# Patient Record
Sex: Male | Born: 1950 | Race: White | Hispanic: No | Marital: Married | Smoking: Never smoker
Health system: Southern US, Community
[De-identification: ages and names within clinical notes are randomized; demographics above are authoritative.]

## PROBLEM LIST (undated history)

## (undated) DIAGNOSIS — K222 Esophageal obstruction: Secondary | ICD-10-CM

## (undated) DIAGNOSIS — K5790 Diverticulosis of intestine, part unspecified, without perforation or abscess without bleeding: Secondary | ICD-10-CM

## (undated) DIAGNOSIS — R739 Hyperglycemia, unspecified: Secondary | ICD-10-CM

## (undated) DIAGNOSIS — I739 Peripheral vascular disease, unspecified: Secondary | ICD-10-CM

## (undated) DIAGNOSIS — I10 Essential (primary) hypertension: Secondary | ICD-10-CM

## (undated) DIAGNOSIS — K219 Gastro-esophageal reflux disease without esophagitis: Secondary | ICD-10-CM

## (undated) DIAGNOSIS — K529 Noninfective gastroenteritis and colitis, unspecified: Secondary | ICD-10-CM

## (undated) DIAGNOSIS — F32A Depression, unspecified: Secondary | ICD-10-CM

## (undated) DIAGNOSIS — F329 Major depressive disorder, single episode, unspecified: Secondary | ICD-10-CM

## (undated) HISTORY — DX: Major depressive disorder, single episode, unspecified: F32.9

## (undated) HISTORY — DX: Peripheral vascular disease, unspecified: I73.9

## (undated) HISTORY — DX: Noninfective gastroenteritis and colitis, unspecified: K52.9

## (undated) HISTORY — DX: Gastro-esophageal reflux disease without esophagitis: K21.9

## (undated) HISTORY — DX: Essential (primary) hypertension: I10

## (undated) HISTORY — DX: Hyperglycemia, unspecified: R73.9

## (undated) HISTORY — DX: Diverticulosis of intestine, part unspecified, without perforation or abscess without bleeding: K57.90

## (undated) HISTORY — DX: Depression, unspecified: F32.A

## (undated) HISTORY — DX: Esophageal obstruction: K22.2

---

## 1998-03-14 ENCOUNTER — Ambulatory Visit (HOSPITAL_COMMUNITY): Admission: RE | Admit: 1998-03-14 | Discharge: 1998-03-14 | Payer: Self-pay | Admitting: Endocrinology

## 1999-04-30 ENCOUNTER — Encounter: Payer: Self-pay | Admitting: Endocrinology

## 1999-04-30 ENCOUNTER — Ambulatory Visit (HOSPITAL_COMMUNITY): Admission: RE | Admit: 1999-04-30 | Discharge: 1999-04-30 | Payer: Self-pay | Admitting: Endocrinology

## 1999-12-03 ENCOUNTER — Encounter: Admission: RE | Admit: 1999-12-03 | Discharge: 2000-03-02 | Payer: Self-pay | Admitting: Endocrinology

## 2000-12-22 ENCOUNTER — Emergency Department (HOSPITAL_COMMUNITY): Admission: EM | Admit: 2000-12-22 | Discharge: 2000-12-22 | Payer: Self-pay | Admitting: Emergency Medicine

## 2000-12-22 ENCOUNTER — Encounter: Payer: Self-pay | Admitting: Emergency Medicine

## 2005-04-08 ENCOUNTER — Emergency Department (HOSPITAL_COMMUNITY): Admission: EM | Admit: 2005-04-08 | Discharge: 2005-04-08 | Payer: Self-pay | Admitting: Family Medicine

## 2005-04-23 ENCOUNTER — Ambulatory Visit: Payer: Self-pay | Admitting: Endocrinology

## 2005-05-07 ENCOUNTER — Ambulatory Visit: Payer: Self-pay | Admitting: Endocrinology

## 2005-06-18 ENCOUNTER — Ambulatory Visit: Payer: Self-pay | Admitting: Endocrinology

## 2005-07-09 ENCOUNTER — Ambulatory Visit: Payer: Self-pay | Admitting: Endocrinology

## 2005-10-08 ENCOUNTER — Ambulatory Visit: Payer: Self-pay | Admitting: Endocrinology

## 2006-10-21 ENCOUNTER — Ambulatory Visit: Payer: Self-pay | Admitting: Endocrinology

## 2006-11-18 ENCOUNTER — Ambulatory Visit: Payer: Self-pay | Admitting: Endocrinology

## 2006-11-18 LAB — CONVERTED CEMR LAB
Basophils Relative: 1.7 % — ABNORMAL HIGH (ref 0.0–1.0)
Bilirubin Urine: NEGATIVE
Bilirubin, Direct: 0.1 mg/dL (ref 0.0–0.3)
CO2: 28 meq/L (ref 19–32)
Cholesterol: 228 mg/dL (ref 0–200)
Creatinine, Ser: 0.9 mg/dL (ref 0.4–1.5)
Direct LDL: 170.5 mg/dL
Eosinophils Relative: 8 % — ABNORMAL HIGH (ref 0.0–5.0)
GFR calc Af Amer: 114 mL/min
Glucose, Bld: 105 mg/dL — ABNORMAL HIGH (ref 70–99)
HCT: 44.3 % (ref 39.0–52.0)
HDL: 29.8 mg/dL — ABNORMAL LOW (ref >39.0)
Hemoglobin: 15.2 g/dL (ref 13.0–17.0)
Leukocytes, UA: NEGATIVE
Lymphocytes Relative: 30.6 % (ref 12.0–46.0)
Monocytes Absolute: 0.7 10*3/uL (ref 0.2–0.7)
Monocytes Relative: 10.2 % (ref 3.0–11.0)
Neutro Abs: 3.1 10*3/uL (ref 1.4–7.7)
Neutrophils Relative %: 49.5 % (ref 43.0–77.0)
Potassium: 4.4 meq/L (ref 3.5–5.1)
RDW: 13 % (ref 11.5–14.6)
Sodium: 140 meq/L (ref 135–145)
Specific Gravity, Urine: >=1.03 (ref 1.000–1.03)
TSH: 1.58 microintl units/mL (ref 0.35–5.50)
Total Bilirubin: 0.9 mg/dL (ref 0.3–1.2)
Total Protein: 7.2 g/dL (ref 6.0–8.3)
Urobilinogen, UA: 0.2 (ref 0.0–1.0)

## 2006-12-23 ENCOUNTER — Ambulatory Visit: Payer: Self-pay | Admitting: Endocrinology

## 2007-02-03 ENCOUNTER — Encounter: Payer: Self-pay | Admitting: Endocrinology

## 2007-02-10 ENCOUNTER — Ambulatory Visit: Payer: Self-pay | Admitting: Endocrinology

## 2007-03-12 ENCOUNTER — Encounter: Payer: Self-pay | Admitting: Endocrinology

## 2007-03-12 DIAGNOSIS — M109 Gout, unspecified: Secondary | ICD-10-CM

## 2007-03-12 DIAGNOSIS — I10 Essential (primary) hypertension: Secondary | ICD-10-CM | POA: Insufficient documentation

## 2007-03-17 ENCOUNTER — Ambulatory Visit: Payer: Self-pay | Admitting: Endocrinology

## 2007-03-17 LAB — CONVERTED CEMR LAB
ALT: 25 units/L (ref 0–53)
AST: 21 units/L (ref 0–37)
Alkaline Phosphatase: 57 units/L (ref 39–117)
BUN: 21 mg/dL (ref 6–23)
Bilirubin, Direct: 0.1 mg/dL (ref 0.0–0.3)
CO2: 27 meq/L (ref 19–32)
Calcium: 9.1 mg/dL (ref 8.4–10.5)
Chloride: 108 meq/L (ref 96–112)
Direct LDL: 163.6 mg/dL
GFR calc Af Amer: 100 mL/min
GFR calc non Af Amer: 83 mL/min
Glucose, Bld: 108 mg/dL — ABNORMAL HIGH (ref 70–99)
Testosterone: 275.66 ng/dL — ABNORMAL LOW (ref 350.00–890)
Total Protein: 7.4 g/dL (ref 6.0–8.3)
Triglycerides: 211 mg/dL (ref 0–149)

## 2007-03-24 ENCOUNTER — Ambulatory Visit: Payer: Self-pay | Admitting: Endocrinology

## 2007-07-14 ENCOUNTER — Ambulatory Visit: Payer: Self-pay | Admitting: Endocrinology

## 2007-07-14 DIAGNOSIS — E291 Testicular hypofunction: Secondary | ICD-10-CM | POA: Insufficient documentation

## 2007-07-14 DIAGNOSIS — E7800 Pure hypercholesterolemia, unspecified: Secondary | ICD-10-CM

## 2007-07-15 LAB — CONVERTED CEMR LAB
FSH: 5.5 milliintl units/mL
HDL: 27.7 mg/dL — ABNORMAL LOW (ref >39.0)
LH: 3.4 milliintl units/mL
Prolactin: 12.4 ng/mL
Total Bilirubin: 0.9 mg/dL (ref 0.3–1.2)
Total CHOL/HDL Ratio: 9
Total Protein: 7.9 g/dL (ref 6.0–8.3)
Triglycerides: 207 mg/dL (ref 0–149)

## 2007-07-21 ENCOUNTER — Ambulatory Visit: Payer: Self-pay | Admitting: Endocrinology

## 2007-07-27 ENCOUNTER — Encounter: Payer: Self-pay | Admitting: Endocrinology

## 2007-08-11 ENCOUNTER — Ambulatory Visit: Payer: Self-pay | Admitting: Gastroenterology

## 2007-08-18 ENCOUNTER — Encounter: Payer: Self-pay | Admitting: Gastroenterology

## 2007-08-18 ENCOUNTER — Ambulatory Visit: Payer: Self-pay | Admitting: Gastroenterology

## 2007-08-18 DIAGNOSIS — K5730 Diverticulosis of large intestine without perforation or abscess without bleeding: Secondary | ICD-10-CM | POA: Insufficient documentation

## 2007-08-18 DIAGNOSIS — K5289 Other specified noninfective gastroenteritis and colitis: Secondary | ICD-10-CM | POA: Insufficient documentation

## 2007-09-08 ENCOUNTER — Ambulatory Visit: Payer: Self-pay | Admitting: Gastroenterology

## 2007-09-08 DIAGNOSIS — R1310 Dysphagia, unspecified: Secondary | ICD-10-CM | POA: Insufficient documentation

## 2007-09-22 ENCOUNTER — Ambulatory Visit: Payer: Self-pay

## 2007-09-22 ENCOUNTER — Telehealth: Payer: Self-pay | Admitting: Endocrinology

## 2007-09-22 ENCOUNTER — Ambulatory Visit: Payer: Self-pay | Admitting: Endocrinology

## 2007-09-22 DIAGNOSIS — M79609 Pain in unspecified limb: Secondary | ICD-10-CM | POA: Insufficient documentation

## 2007-09-22 DIAGNOSIS — N529 Male erectile dysfunction, unspecified: Secondary | ICD-10-CM | POA: Insufficient documentation

## 2007-10-16 ENCOUNTER — Encounter: Payer: Self-pay | Admitting: Endocrinology

## 2007-10-27 ENCOUNTER — Ambulatory Visit: Payer: Self-pay | Admitting: Gastroenterology

## 2007-11-03 ENCOUNTER — Ambulatory Visit: Payer: Self-pay | Admitting: Endocrinology

## 2007-11-03 DIAGNOSIS — K769 Liver disease, unspecified: Secondary | ICD-10-CM

## 2007-11-07 LAB — CONVERTED CEMR LAB
ALT: 24 units/L (ref 0–53)
AST: 22 units/L (ref 0–37)
Albumin: 3.7 g/dL (ref 3.5–5.2)
Hgb A1c MFr Bld: 5.9 % (ref 4.6–6.0)
Total Protein: 7.5 g/dL (ref 6.0–8.3)
Uric Acid, Serum: 8 mg/dL — ABNORMAL HIGH (ref 4.0–7.8)

## 2007-11-17 ENCOUNTER — Ambulatory Visit: Payer: Self-pay | Admitting: Gastroenterology

## 2007-11-17 ENCOUNTER — Encounter: Payer: Self-pay | Admitting: Gastroenterology

## 2007-11-17 DIAGNOSIS — K222 Esophageal obstruction: Secondary | ICD-10-CM

## 2007-11-17 DIAGNOSIS — K2900 Acute gastritis without bleeding: Secondary | ICD-10-CM | POA: Insufficient documentation

## 2007-12-05 ENCOUNTER — Encounter: Payer: Self-pay | Admitting: Endocrinology

## 2007-12-05 DIAGNOSIS — M1990 Unspecified osteoarthritis, unspecified site: Secondary | ICD-10-CM | POA: Insufficient documentation

## 2007-12-21 ENCOUNTER — Encounter: Payer: Self-pay | Admitting: Endocrinology

## 2007-12-28 DIAGNOSIS — K219 Gastro-esophageal reflux disease without esophagitis: Secondary | ICD-10-CM

## 2008-01-09 ENCOUNTER — Telehealth (INDEPENDENT_AMBULATORY_CARE_PROVIDER_SITE_OTHER): Payer: Self-pay | Admitting: *Deleted

## 2008-01-12 ENCOUNTER — Ambulatory Visit: Payer: Self-pay | Admitting: Endocrinology

## 2008-01-12 DIAGNOSIS — R109 Unspecified abdominal pain: Secondary | ICD-10-CM

## 2008-01-18 LAB — CONVERTED CEMR LAB
Basophils Absolute: 0 10*3/uL (ref 0.0–0.1)
Bilirubin Urine: NEGATIVE
Hemoglobin: 15.1 g/dL (ref 13.0–17.0)
Leukocytes, UA: NEGATIVE
Lymphocytes Relative: 24.8 % (ref 12.0–46.0)
MCHC: 34.9 g/dL (ref 30.0–36.0)
Neutro Abs: 4.6 10*3/uL (ref 1.4–7.7)
Neutrophils Relative %: 60.3 % (ref 43.0–77.0)
Nitrite: NEGATIVE
Platelets: 370 10*3/uL (ref 150–400)
RDW: 13 % (ref 11.5–14.6)
pH: 5.5 (ref 5.0–8.0)

## 2008-01-19 ENCOUNTER — Ambulatory Visit: Payer: Self-pay | Admitting: Endocrinology

## 2008-01-19 DIAGNOSIS — L255 Unspecified contact dermatitis due to plants, except food: Secondary | ICD-10-CM

## 2008-12-27 ENCOUNTER — Ambulatory Visit: Payer: Self-pay | Admitting: Endocrinology

## 2008-12-27 DIAGNOSIS — F329 Major depressive disorder, single episode, unspecified: Secondary | ICD-10-CM

## 2008-12-27 DIAGNOSIS — R51 Headache: Secondary | ICD-10-CM

## 2008-12-27 DIAGNOSIS — R519 Headache, unspecified: Secondary | ICD-10-CM | POA: Insufficient documentation

## 2008-12-27 LAB — CONVERTED CEMR LAB
BUN: 18 mg/dL (ref 6–23)
Calcium: 9.3 mg/dL (ref 8.4–10.5)
GFR calc non Af Amer: 82.17 mL/min (ref >60)
Glucose, Bld: 84 mg/dL (ref 70–99)
Sodium: 143 meq/L (ref 135–145)

## 2009-01-02 ENCOUNTER — Ambulatory Visit: Payer: Self-pay | Admitting: Internal Medicine

## 2009-01-02 DIAGNOSIS — M25539 Pain in unspecified wrist: Secondary | ICD-10-CM

## 2009-01-09 ENCOUNTER — Encounter: Payer: Self-pay | Admitting: Endocrinology

## 2009-02-11 ENCOUNTER — Telehealth: Payer: Self-pay | Admitting: Endocrinology

## 2009-03-12 ENCOUNTER — Emergency Department (HOSPITAL_BASED_OUTPATIENT_CLINIC_OR_DEPARTMENT_OTHER): Admission: EM | Admit: 2009-03-12 | Discharge: 2009-03-12 | Payer: Self-pay | Admitting: Emergency Medicine

## 2009-03-21 ENCOUNTER — Telehealth: Payer: Self-pay | Admitting: Endocrinology

## 2009-04-04 ENCOUNTER — Telehealth: Payer: Self-pay | Admitting: Endocrinology

## 2009-08-22 ENCOUNTER — Emergency Department (HOSPITAL_COMMUNITY): Admission: EM | Admit: 2009-08-22 | Discharge: 2009-08-22 | Payer: Self-pay | Admitting: Emergency Medicine

## 2009-08-23 ENCOUNTER — Telehealth: Payer: Self-pay | Admitting: Internal Medicine

## 2009-08-26 ENCOUNTER — Encounter (INDEPENDENT_AMBULATORY_CARE_PROVIDER_SITE_OTHER): Payer: Self-pay | Admitting: *Deleted

## 2009-09-09 ENCOUNTER — Encounter (INDEPENDENT_AMBULATORY_CARE_PROVIDER_SITE_OTHER): Payer: Self-pay

## 2009-09-10 ENCOUNTER — Ambulatory Visit: Payer: Self-pay | Admitting: Gastroenterology

## 2009-09-19 ENCOUNTER — Telehealth: Payer: Self-pay | Admitting: Gastroenterology

## 2009-09-19 ENCOUNTER — Ambulatory Visit: Payer: Self-pay | Admitting: Gastroenterology

## 2009-09-23 ENCOUNTER — Encounter: Payer: Self-pay | Admitting: Gastroenterology

## 2009-09-23 ENCOUNTER — Telehealth: Payer: Self-pay | Admitting: Gastroenterology

## 2009-10-01 ENCOUNTER — Ambulatory Visit (HOSPITAL_COMMUNITY): Admission: RE | Admit: 2009-10-01 | Discharge: 2009-10-01 | Payer: Self-pay | Admitting: Gastroenterology

## 2009-10-01 ENCOUNTER — Ambulatory Visit: Payer: Self-pay | Admitting: Gastroenterology

## 2009-10-02 ENCOUNTER — Telehealth: Payer: Self-pay | Admitting: Gastroenterology

## 2009-10-14 ENCOUNTER — Ambulatory Visit: Payer: Self-pay | Admitting: Endocrinology

## 2009-10-14 LAB — CONVERTED CEMR LAB
ALT: 22 units/L (ref 0–53)
Basophils Relative: 1.1 % (ref 0.0–3.0)
Bilirubin, Direct: 0.1 mg/dL (ref 0.0–0.3)
Chloride: 109 meq/L (ref 96–112)
Direct LDL: 169.3 mg/dL
Eosinophils Relative: 3.1 % (ref 0.0–5.0)
HCT: 47.5 % (ref 39.0–52.0)
Hemoglobin, Urine: NEGATIVE
Hemoglobin: 16 g/dL (ref 13.0–17.0)
Lymphs Abs: 1.7 10*3/uL (ref 0.7–4.0)
MCV: 87.9 fL (ref 78.0–100.0)
Monocytes Absolute: 0.9 10*3/uL (ref 0.1–1.0)
Potassium: 4.7 meq/L (ref 3.5–5.1)
RBC: 5.41 M/uL (ref 4.22–5.81)
Total CHOL/HDL Ratio: 6
Total Protein, Urine: NEGATIVE mg/dL
Total Protein: 8.4 g/dL — ABNORMAL HIGH (ref 6.0–8.3)
Uric Acid, Serum: 4.3 mg/dL (ref 4.0–7.8)
Urine Glucose: NEGATIVE mg/dL
VLDL: 24.6 mg/dL (ref 0.0–40.0)
WBC: 9.7 10*3/uL (ref 4.5–10.5)

## 2009-10-16 ENCOUNTER — Telehealth: Payer: Self-pay | Admitting: Endocrinology

## 2009-12-05 ENCOUNTER — Telehealth: Payer: Self-pay | Admitting: Endocrinology

## 2010-03-06 ENCOUNTER — Ambulatory Visit: Payer: Self-pay | Admitting: Endocrinology

## 2010-03-06 ENCOUNTER — Telehealth: Payer: Self-pay | Admitting: Endocrinology

## 2010-03-07 LAB — CONVERTED CEMR LAB
BUN: 22 mg/dL (ref 6–23)
CO2: 27 meq/L (ref 19–32)
Chloride: 107 meq/L (ref 96–112)
Creatinine, Ser: 1.1 mg/dL (ref 0.4–1.5)
Glucose, Bld: 85 mg/dL (ref 70–99)

## 2010-05-29 ENCOUNTER — Ambulatory Visit: Payer: Self-pay | Admitting: Endocrinology

## 2010-05-29 ENCOUNTER — Encounter: Payer: Self-pay | Admitting: Endocrinology

## 2010-05-29 DIAGNOSIS — R079 Chest pain, unspecified: Secondary | ICD-10-CM

## 2010-05-29 DIAGNOSIS — M539 Dorsopathy, unspecified: Secondary | ICD-10-CM

## 2010-05-29 LAB — CONVERTED CEMR LAB
BUN: 24 mg/dL — ABNORMAL HIGH (ref 6–23)
Calcium: 8.8 mg/dL (ref 8.4–10.5)
Creatinine, Ser: 1.1 mg/dL (ref 0.4–1.5)
GFR calc non Af Amer: 76.44 mL/min (ref >60)
Glucose, Bld: 90 mg/dL (ref 70–99)

## 2010-06-04 ENCOUNTER — Encounter: Payer: Self-pay | Admitting: Endocrinology

## 2010-08-28 ENCOUNTER — Ambulatory Visit: Admit: 2010-08-28 | Payer: Self-pay | Admitting: Endocrinology

## 2010-09-02 NOTE — Assessment & Plan Note (Signed)
Summary: FOOT PAIN/NWS   Vital Signs:  Patient profile:   60 year old male Height:      66 inches (167.64 cm) Weight:      252 pounds (114.55 kg) BMI:     40.82 O2 Sat:      98 % on Room air Temp:     97.5 degrees F (36.39 degrees C) oral Pulse rate:   82 / minute BP sitting:   110 / 90  (left arm) Cuff size:   large  Vitals Entered By: Josph Macho RMA (October 14, 2009 11:01 AM)  O2 Flow:  Room air CC: Left foot pain X1week/ pt spouse called and stated pt isn't taking Bayer, Mobic, Crestor, or the Epipen/ CF Is Patient Diabetic? No   Primary Provider:  Minus Breeding MD  CC:  Left foot pain X1week/ pt spouse called and stated pt isn't taking Bayer, Mobic, Crestor, and or the Epipen/ CF.  History of Present Illness: pt states 10 days of intermittent severe left foot pain.  there is slight associated numbness.  he has incomplete relief with tramadol/colchicine.  it started when he dropped a brick on it.  Current Medications (verified): 1)  Excedrin Migraine 250-250-65 Mg  Tabs (Aspirin-Acetaminophen-Caffeine) .... Take Prn 2)  Ultram 50 Mg  Tabs (Tramadol Hcl) .... Take 1 By Mouth As Needed Q6h As Needed Pain 3)  Celexa 40 Mg Tabs (Citalopram Hydrobromide) .... Take 1 Tablet By Mouth Once A Day 4)  Lotrisone 1-0.05 % Crea (Clotrimazole-Betamethasone) .... Apply Liberally To Skin Three Times A  Day 5)  Zestril 40 Mg Tabs (Lisinopril) .... Take 1 Tablet By Mouth Once A Day 6)  Metoprolol Succinate 50 Mg  Tb24 (Metoprolol Succinate) .... Take 1 By Mouth Qd 7)  Crestor 40 Mg  Tabs (Rosuvastatin Calcium) .... Take 1 By Mouth Qd 8)  Bayer Low Strength 81 Mg  Tbec (Aspirin) .... Take 1 By Mouth Qd 9)  Viagra 100 Mg  Tabs (Sildenafil Citrate) .... For As Needed Use 10)  Allopurinol 300 Mg  Tabs (Allopurinol) .... Qd 11)  Colchicine 0.6 Mg  Tabs (Colchicine) .... Qhr As Needed Gout Nte 6/d 12)  Omeprazole 20 Mg  Cpdr (Omeprazole) .Marland Kitchen.. 1 Each Day 30 Minutes Before Meal 13)  Mobic 15  Mg Tabs (Meloxicam) .Marland Kitchen.. 1 By Mouth Once Daily As Needed Pain 14)  Epipen 2-Pak 0.3 Mg/0.25ml (1:1000) Devi (Epinephrine Hcl (Anaphylaxis)) .... As Dir 15)  Colcrys 0.6 Mg Tabs (Colchicine) .Marland Kitchen.. 1 Tav As Needed For Gout May Repeat Once in An Hour- Only 2 Tabs Per 24 Hours  Allergies (verified): 1)  ! 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         denies rash  Physical Exam  General:  obese.  no distress Msk:  left foot: there is moderate erythema/tenderness/warmth at the mtp of the great toe. Additional Exam:  Uric Acid                 4.3 mg/dL                   1.6-1.0  Hemoglobin A1C            5.7 %                       4.6-6.5  PSA-Hyb                   0.83 ng/mL                  0.10-4.00  Cholesterol LDL          169.3 mg/dL   Impression & Recommendations:  Problem # 1:  FOOT PAIN, LEFT (ICD-729.5) related to injury vs #2  Problem # 2:  GOUT (ICD-274.9) uncertain control  Problem # 3:  PURE HYPERCHOLESTEROLEMIA (ICD-272.0) Assessment: Deteriorated needs increased rx  Medications Added to Medication List This Visit: 1)  Colcrys 0.6 Mg Tabs (Colchicine) .Marland Kitchen.. 1 tav as needed for gout may repeat once in an hour- only 2 tabs per 24 hours 2)  Colcrys 0.6 Mg Tabs (Colchicine) .Marland Kitchen.. 1 tab as needed for gout may repeat once in an hour- only 2 tabs per 24 hours 3)  Hydrocodone-acetaminophen 10-325 Mg Tabs (Hydrocodone-acetaminophen) .Marland Kitchen.. 1 every 4 hrs as needed for pain  Other Orders: T-Foot Left Min 3 Views (73630TC) TLB-Lipid Panel (80061-LIPID) TLB-BMP (Basic Metabolic Panel-BMET) (80048-METABOL) TLB-CBC Platelet - w/Differential (85025-CBCD) TLB-Hepatic/Liver Function Pnl (80076-HEPATIC) TLB-TSH (Thyroid Stimulating Hormone) (84443-TSH) TLB-Uric Acid, Blood (84550-URIC) TLB-A1C / Hgb A1C  (Glycohemoglobin) (83036-A1C) TLB-PSA (Prostate Specific Antigen) (84153-PSA) TLB-Udip w/ Micro (81001-URINE) Est. Patient Level IV (29528)  Patient Instructions: 1)  elevate your left foot x 2 days.  the foot should be the highest point of your body. 2)  tests are being ordered for you today.  a few days after the test(s), please call [phone] to hear your test results. 3)  continue colchrys  (maximum 6/day) as needed for pain. 4)  take vicodin 1 every 4 hrs as needed for pain. 5)  when you are feeling better, change the visodin back to tramadol as needed for pain. 6)  please schedule a physical soon. 7)  (update: i left message on phone-tree:  please verify you are on crestor) Prescriptions: COLCRYS 0.6 MG TABS (COLCHICINE) 1 tab as needed for gout may repeat once in an hour- only 2 tabs per 24 hours  #10 x 2   Entered and Authorized by:   Minus Breeding MD   Signed by:   Minus Breeding MD on 10/16/2009   Method used:   Electronically to        [facility]  [address] # [facility]* (retail)       [address]       [place], [place]  [address]       Ph: [phone] or [phone]       Fax: [phone]   RxID:   [id number] HYDROCODONE-ACETAMINOPHEN 10-325 MG TABS (HYDROCODONE-ACETAMINOPHEN) 1 every 4 hrs as needed for pain  #50 x 0   Entered by:   Minus Breeding MD   Authorized by:   Josph Macho RMA   Signed by:   Minus Breeding MD on 10/14/2009   Method used:   Print then Give to Patient   RxID:   [id number]

## 2010-09-02 NOTE — Procedures (Signed)
Summary: Upper Endoscopy w/DIL  Patient: [name] Note: All result statuses are Final unless otherwise noted.  Tests: (1) Upper Endoscopy w/DIL (UED)  UED Upper Endoscopy w/DIL                             DONE     [facility]     [address]     [place], [place]  [address]           ENDOSCOPY PROCEDURE REPORT           PATIENT:  Russell Huerta, Russell Huerta  MR#:  [id number]     BIRTHDATE:  1950-08-09, 58 yrs. old  GENDER:  male           ENDOSCOPIST:  Barbette Hair. [name], MD     ASSISTANT:           PROCEDURE DATE:  09/19/2009     PROCEDURE:  EGD with biopsy     ASA CLASS:  Class II     INDICATIONS:  1) dysphagia           MEDICATIONS:   Fentanyl 75 mcg IV, Versed 9 mg IV, glycopyrrolate     (Robinal) 0.2 mg IV, 0.6cc simethancone 0.6 cc PO     TOPICAL ANESTHETIC:  Exactacain Spray           DESCRIPTION OF PROCEDURE:   After the risks benefits and     alternatives of the procedure were thoroughly explained, informed     consent was obtained.  The [id number] endoscope was     introduced through the mouth and advanced to the third portion of     the duodenum, without limitations.  The instrument was slowly     withdrawn as the mucosa was carefully examined.     <<PROCEDUREIMAGES>>           Multiple ulcers were found. Multiple superficial ulcers throughout     gastric antrum and duodenal bulb. Bx of antrum taken (see image2     and image3).  Esophagitis was found. Diffuse erosive esophagitis     encompassing lower 1/2 of esophagus (see image5).  A stricture was     found at the gastroesophageal junction (see image1). Early     esophageal stricture with surrounding marked inflammation     Dilation was then performed at the           COMPLICATIONS:  None           ENDOSCOPIC IMPRESSION:     1) Ulcers, multiple in stomach and duodenum     2) Severe, erosiveEsophagitis     3) Stricture at the gastroesophageal junction     RECOMMENDATIONS:     1)  d/c  NSAIDS and ASA2)  increase omeprazole to BID     3) check gastrin level     4) EGD with balloon dilitation in 2 weeks           Dilitation deferred because of severe inflammatory changes in area     of stricture           REPEAT EXAM:  No           ______________________________     Barbette Hair. [name], MD           CC:  Rene Paci, M.D.           n.     eSIGNED:   Barbette Hair.  Berley Gambrell at 09/19/2009 12:02 PM           Unknown Foley, [id number]  Note: An exclamation mark (!) indicates a result that was not dispersed into the flowsheet. Document Creation Date: 09/19/2009 12:02 PM _______________________________________________________________________  (1) Order result status: Final Collection or observation date-time: 09/19/2009 11:52 Requested date-time:  Receipt date-time:  Reported date-time:  Referring Physician:   Ordering Physician: Melvia Heaps [phone]) Specimen Source:  Source: Launa Grill Order Number: [id number] Lab site:

## 2010-09-02 NOTE — Progress Notes (Signed)
Summary: pain medication?  Phone Note Call from Patient   Caller: Spouse Reason for Call: Talk to Doctor Summary of Call: Pt's wife callled asking if pt was suppose to receive any pain medication for his back at visit--please advise Initial call taken by: Brenton Grills MA,  March 06, 2010 4:45 PM  Follow-up for Phone Call        i printed Follow-up by: Minus Breeding MD,  March 07, 2010 7:59 AM  Additional Follow-up for Phone Call Additional follow up Details #1::        F[facility] to [facility] Additional Follow-up by: Brenton Grills MA,  March 07, 2010 9:40 AM    Prescriptions: HYDROCODONE-ACETAMINOPHEN 10-325 MG TABS (HYDROCODONE-ACETAMINOPHEN) 1 every 4 hrs as needed for pain  #50 x 1   Entered and Authorized by:   Minus Breeding MD   Signed by:   Minus Breeding MD on 03/07/2010   Method used:   Print then Give to Patient   RxID:   [id number]

## 2010-09-02 NOTE — Progress Notes (Signed)
Summary: Endo/Balloon Dil. Scheduled   Phone Note From Other Clinic   Summary of Call: Pt. is in [facility] recovery, needs to be scheduled for an Endo/Balloon Dil. He is scheduled at [facility] on 10-01-09 at 12:30pm. All instructions reviewed w/pt. wife at bedside. Pt. instructed to call back as needed.  Initial call taken by: [name] LPN,  September 19, 2009 12:49 PM

## 2010-09-02 NOTE — Miscellaneous (Signed)
Summary: Lec previsit.  Clinical Lists Changes  Observations: Added new observation of ALLERGY REV: Done (09/10/2009 16:27)

## 2010-09-02 NOTE — Progress Notes (Signed)
Summary: TRIAGE-? re meds   Phone Note Call from Patient Call back at Home Phone [phone]   Caller: Vertis Kelch Call For: [name] Reason for Call: Talk to Nurse Summary of Call: Wife has questions regarding what type of medication can her husband take for headaches Initial call taken by: Tawni Levy,  September 23, 2009 12:01 PM  Follow-up for Phone Call        Pt. may use Tylenol products, no NSAID or ASA.  Pt. instructed to call back as needed.  Follow-up by: [name] LPN,  September 23, 2009 12:32 PM

## 2010-09-02 NOTE — Progress Notes (Signed)
Summary: RX?  Phone Note Call from Patient   Caller: Spouse [phone] Summary of Call: pt called stating that he thinks that he was supposed to receive RX for Colcys for foot pain in addition to Hydrocodone. please advise Initial call taken by: Margaret Pyle, CMA,  October 16, 2009 9:15 AM  Follow-up for Phone Call        sent Follow-up by: Minus Breeding MD,  October 16, 2009 11:52 AM  Additional Follow-up for Phone Call Additional follow up Details #1::        pt's spouse informed Additional Follow-up by: Margaret Pyle, CMA,  October 16, 2009 1:14 PM

## 2010-09-02 NOTE — Letter (Signed)
Summary: [facility]  [facility]   Imported By: Sherian Rein 09/27/2009 15:13:28  _____________________________________________________________________  External Attachment:    Type:   Image     Comment:   External Document

## 2010-09-02 NOTE — Miscellaneous (Signed)
Summary: Orders Update-Labs  Clinical Lists Changes  Orders: Added new Test order of [place], [place] [phone]) - Signed

## 2010-09-02 NOTE — Progress Notes (Signed)
Summary: Quantity increase  Phone Note Call from Patient Call back at Home Phone [phone]   Caller: Patient Summary of Call: pt called requesting an increase in quantity of Colcyrs. per pt had pharmacy pt would pay the same copay fo #10 as he would #60. please advise Initial call taken by: Margaret Pyle, CMA,  Dec 05, 2009 10:06 AM  Follow-up for Phone Call        i refilled #50, x 1 cpx is due Follow-up by: Minus Breeding MD,  Dec 05, 2009 10:22 AM    Prescriptions: COLCRYS 0.6 MG TABS (COLCHICINE) 1 tab as needed for gout may repeat once in an hour- only 2 tabs per 24 hours  #50 x 0   Entered and Authorized by:   Minus Breeding MD   Signed by:   Minus Breeding MD on 12/05/2009   Method used:   Electronically to        [facility]  [address] # [facility]* (retail)       [address]       [place], [place]  [address]       Ph: [phone] or [phone]       Fax: [phone]   RxID:   [id number]

## 2010-09-02 NOTE — Letter (Signed)
Summary: Results Letter  [facility]  [address] [place], [place] [address]   Phone: [phone]  Fax: [phone]        September 23, 2009 [name]: [id number]    Russell Huerta [address] [place], [place]  [address]    Dear Mr. Wamble,   Your biopsies demonstrated inflammatory changes only.    Please follow the recommendations previously discussed.  Should you have any immediate concerns or questions, feel free to contact me at the office.    Sincerely,  Barbette Hair. [name], M.D., [facility]          Sincerely,  Louis Meckel MD  This letter has been electronically signed by your physician.  Appended Document: Results Letter Letter mailed 2.23.11

## 2010-09-02 NOTE — Procedures (Signed)
Summary: Upper Endoscopy  Patient: Russell Huerta Note: All result statuses are Final unless otherwise noted.  Tests: (1) Upper Endoscopy (EGD)   EGD Upper Endoscopy       DONE     [facility]     [address] [place], [place]  [address]           ENDOSCOPY PROCEDURE REPORT           PATIENT:  Russell Huerta, Russell Huerta  MR#:  [id number]     BIRTHDATE:  [birth year], 58 yrs. old  GENDER:  male           ENDOSCOPIST:  Barbette Hair. [name], MD     Referred by:           PROCEDURE DATE:  10/01/2009     PROCEDURE:  EGD with balloon dilatation     ASA CLASS:  Class II     INDICATIONS:  dilation of esophageal stricture           MEDICATIONS:   Fentanyl 75 mcg, Versed 9 mg IV, glycopyrrolate     (Robinal) 0.2 mg IV     TOPICAL ANESTHETIC:  Cetacaine Spray           DESCRIPTION OF PROCEDURE:   After the risks benefits and     alternatives of the procedure were thoroughly explained, informed     consent was obtained.  The  endoscope was introduced through the     mouth and advanced to the third portion of the duodenum, without     limitations.  The instrument was slowly withdrawn as the mucosa     was fully examined.     <<PROCEDUREIMAGES>>           A stricture was found at the gastroesophageal junction. balloon     dilation 15-16 (see image1).5-18; moderate resistance;no heme  A     hiatal hernia was found. 3cm sliding hiatal hernia  Otherwise the     examination was normal.    Retroflexed views revealed no     abnormalities.    The scope was then withdrawn from the patient     and the procedure completed.           COMPLICATIONS:  None           ENDOSCOPIC IMPRESSION:     1) Stricture at the gastroesophageal junction - s/p balloon     dilitation     2) Hiatal hernia     3) Otherwise normal examination     RECOMMENDATIONS:     1) Call office next 2-3 days to schedule an office appointment     for 1 month     2) dilatations PRN           REPEAT EXAM:  No        ______________________________     Barbette Hair. [name], MD           CC:  Newt Lukes, MD           n.     Rosalie DoctorBarbette Hair. Kaplan at 10/01/2009 01:07 PM           Unknown Foley, [id number]  Note: An exclamation mark (!) indicates a result that was not dispersed into the flowsheet. Document Creation Date: 10/01/2009 1:08 PM _______________________________________________________________________  (1) Order result status: Final Collection or observation date-time: 10/01/2009 13:03 Requested date-time:  Receipt date-time:  Reported date-time:  Referring Physician:  Ordering Physician: Melvia Heaps [phone]) Specimen Source:  Source: Launa Grill Order Number: [id number] Lab site:

## 2010-09-02 NOTE — Assessment & Plan Note (Signed)
Summary: HURT HIS BACK / NWS   Vital Signs:  Patient profile:   60 year old male Height:      66 inches Weight:      251 pounds BMI:     40.66 O2 Sat:      97 % on Room air Temp:     98.5 degrees F oral Pulse rate:   86 / minute BP sitting:   112 / 80  (left arm) Cuff size:   large  Vitals Entered By: Brenton Grills MA (March 06, 2010 3:38 PM)  O2 Flow:  Room air CC: Fall/Back pain x 9 days/pain is radiating to legs/aj Pain Assessment Patient in pain? yes     Location: back Intensity: 10 Type: sharp Onset of pain  Sudden   Primary Provider:  Minus Breeding MD  CC:  Fall/Back pain x 9 days/pain is radiating to legs/aj.  History of Present Illness: 9 days of moderate pain at lower back, rad to posterior aspect of the left leg.  no associated numbness.  it started when he fell on his back.  no improvement since then.  Current Medications (verified): 1)  Excedrin Migraine 250-250-65 Mg  Tabs (Aspirin-Acetaminophen-Caffeine) .... Take Prn 2)  Ultram 50 Mg  Tabs (Tramadol Hcl) .... Take 1 By Mouth As Needed Q6h As Needed Pain 3)  Celexa 40 Mg Tabs (Citalopram Hydrobromide) .... Take 1 Tablet By Mouth Once A Day 4)  Lotrisone 1-0.05 % Crea (Clotrimazole-Betamethasone) .... Apply Liberally To Skin Three Times A  Day 5)  Zestril 40 Mg Tabs (Lisinopril) .... Take 1 Tablet By Mouth Once A Day 6)  Metoprolol Succinate 50 Mg  Tb24 (Metoprolol Succinate) .... Take 1 By Mouth Qd 7)  Bayer Low Strength 81 Mg  Tbec (Aspirin) .... Take 1 By Mouth Qd 8)  Viagra 100 Mg  Tabs (Sildenafil Citrate) .... For As Needed Use 9)  Allopurinol 300 Mg  Tabs (Allopurinol) .... Qd 10)  Omeprazole 20 Mg  Cpdr (Omeprazole) .Marland Kitchen.. 1 Each Day 30 Minutes Before Meal 11)  Epipen 2-Pak 0.3 Mg/0.55ml (1:1000) Devi (Epinephrine Hcl (Anaphylaxis)) .... As Dir 12)  Colcrys 0.6 Mg Tabs (Colchicine) .Marland Kitchen.. 1 Tab As Needed For Gout May Repeat Once in An Hour- Only 2 Tabs Per 24 Hours 13)  Hydrocodone-Acetaminophen  10-325 Mg Tabs (Hydrocodone-Acetaminophen) .Marland Kitchen.. 1 Every 4 Hrs As Needed For Pain  Allergies (verified): 1)  ! 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         he has dizziness due to the heat.    Physical Exam  General:  obese.  no distress  Msk:  spine is nontender muscle bulk and strength are grossly normal.  no obvious joint swelling.  gait is slow, but steady  Neurologic:  sensation is intact to touch on the feet  Additional Exam:   Hemoglobin A1C            5.7 %      Impression & Recommendations:  Problem # 1:  DEGENERATIVE JOINT DISEASE (ICD-715.90) causing back pain  Problem # 2:  HYPERGLYCEMIA (ICD-790.29) Assessment: Unchanged  Other Orders: TLB-A1C / Hgb A1C (Glycohemoglobin) (83036-A1C) TLB-BMP (Basic Metabolic Panel-BMET) (80048-METABOL) Est. Patient Level IV ([address])  Patient Instructions: 1)  you should have an mri of the lumbar spine.  call if you decide to get it  done.   2)  Please schedule a physical appointment in 1 month. 3)  blood tests are being ordered for you today.  please call [phone] to hear your test results. 4)  (update: i left message on phone-tree:  rx as we discussed) 5)  update:  see vicodin prescription 6)  .

## 2010-09-02 NOTE — Assessment & Plan Note (Signed)
Summary: FU/NWS   Vital Signs:  Patient profile:   60 year old male Height:      66 inches (167.64 cm) Weight:      249 pounds (113.18 kg) BMI:     40.33 O2 Sat:      96 % on Room air Temp:     98.3 degrees F (36.83 degrees C) oral Pulse rate:   84 / minute BP sitting:   142 / 86  (left arm) Cuff size:   large  Vitals Entered By: Brenton Grills MA (May 29, 2010 3:13 PM)  O2 Flow:  Room air CC: Follow-up visit/pt c/o pain on left side x 3 weeks/aj Is Patient Diabetic? No   Primary Provider:  Minus Breeding MD  CC:  Follow-up visit/pt c/o pain on left side x 3 weeks/aj.  History of Present Illness: pt states 3 weeks of intermittent moderate pain rad across the left anterior chest to the left posterior chest.  he had neg cardiac w/u at [facility], in 2008.    Current Medications (verified): 1)  Excedrin Migraine 250-250-65 Mg  Tabs (Aspirin-Acetaminophen-Caffeine) .... Take Prn 2)  Ultram 50 Mg  Tabs (Tramadol Hcl) .... Take 1 By Mouth As Needed Q6h As Needed Pain 3)  Celexa 40 Mg Tabs (Citalopram Hydrobromide) .... Take 1 Tablet By Mouth Once A Day 4)  Lotrisone 1-0.05 % Crea (Clotrimazole-Betamethasone) .... Apply Liberally To Skin Three Times A  Day 5)  Zestril 40 Mg Tabs (Lisinopril) .... Take 1 Tablet By Mouth Once A Day 6)  Metoprolol Succinate 50 Mg  Tb24 (Metoprolol Succinate) .... Take 1 By Mouth Qd 7)  Bayer Low Strength 81 Mg  Tbec (Aspirin) .... Take 1 By Mouth Qd 8)  Viagra 100 Mg  Tabs (Sildenafil Citrate) .... For As Needed Use 9)  Allopurinol 300 Mg  Tabs (Allopurinol) .... Qd 10)  Omeprazole 20 Mg  Cpdr (Omeprazole) .Marland Kitchen.. 1 Each Day 30 Minutes Before Meal 11)  Epipen 2-Pak 0.3 Mg/0.45ml (1:1000) Devi (Epinephrine Hcl (Anaphylaxis)) .... As Dir 12)  Colcrys 0.6 Mg Tabs (Colchicine) .Marland Kitchen.. 1 Tab As Needed For Gout May Repeat Once in An Hour- Only 2 Tabs Per 24 Hours 13)  Hydrocodone-Acetaminophen 10-325 Mg Tabs (Hydrocodone-Acetaminophen) .Marland Kitchen.. 1 Every 4 Hrs  As Needed For Pain  Allergies (verified): 1)  ! 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         he has occasional gout sxs.    Physical Exam  General:  obese.  no distress  Chest Wall:  nontender Lungs:  Clear to auscultation bilaterally. Normal respiratory effort.  Additional Exam:  Hemoglobin A1C            5.7 %    Impression & Recommendations:  Problem # 1:  CHEST PAIN (ICD-786.50) musculoskeletal in etiology  Problem # 2:  HYPERGLYCEMIA (ICD-790.29) Assessment: Unchanged  Problem # 3:  HYPERTENSION (ICD-401.9) needs increased rx  Medications Added to Medication List This Visit: 1)  Lasix 20 Mg Tabs (Furosemide) .Marland Kitchen.. 1 tab once daily  Other Orders: EKG w/ Interpretation (93[address]) T-D-Dimer Fibrin Derivatives Quantitive (60454-09811) T-2 View CXR (71020TC) TLB-BMP (Basic Metabolic Panel-BMET) (80048-METABOL) TLB-A1C / Hgb A1C (Glycohemoglobin) (83036-A1C) TLB-Uric Acid, Blood (84550-URIC) Orthopedic Surgeon Referral (Ortho Surgeon) Est. Patient Level II ([address])  Patient Instructions: 1)  check chest-x-ray and blood  tests today. 2)  refer orthopedics.  you will be called with a day and time for an appointment. 3)  add furosemide 20 mg once daily. 4)  Please schedule a follow-up appointment in 3 months. 5)  (update: i left message on phone-tree:  rx as we discussed) Prescriptions: LASIX 20 MG TABS (FUROSEMIDE) 1 tab once daily  #30 x 11   Entered and Authorized by:   Minus Breeding MD   Signed by:   Minus Breeding MD on 05/29/2010   Method used:   Electronically to        [facility]  [address] # [facility]* (retail)       [address]       [place], [place]  [address]       Ph: [phone] or [phone]       Fax: [phone]   RxID:    [id number] HYDROCODONE-ACETAMINOPHEN 10-325 MG TABS (HYDROCODONE-ACETAMINOPHEN) 1 every 4 hrs as needed for pain  #50 x 1   Entered and Authorized by:   Minus Breeding MD   Signed by:   Minus Breeding MD on 05/29/2010   Method used:   Print then Give to Patient   RxID:   [id number]    Orders Added: 1)  EKG w/ Interpretation [93000] 2)  T-D-Dimer Fibrin Derivatives Quantitive [95638-75643] 3)  T-2 View CXR [71020TC] 4)  TLB-BMP (Basic Metabolic Panel-BMET) [80048-METABOL] 5)  TLB-A1C / Hgb A1C (Glycohemoglobin) [83036-A1C] 6)  TLB-Uric Acid, Blood [84550-URIC] 7)  Orthopedic Surgeon Referral [Ortho Surgeon] 8)  Est. Patient Level II [[address]951]

## 2010-09-02 NOTE — Letter (Signed)
Summary: EGD Instructions  [facility]  [address] [place], [place] [address]   Phone: [phone]  Fax: [phone]       Russell Huerta    November 13, 1950    [name]: [id number]       Procedure Day Dorna Bloom: Jake Shark MARCH 1ST, 2011     Arrival Time: 11:30AM     Procedure Time: 12:30PM     Location of Procedure: [facility] ( Outpatient Registration)   PREPARATION FOR ENDOSCOPY/BALLOON DIL.   ON THE DAY OF THE PROCEDURE: TUESDAY MARCH 1ST, 2011  1.   No solid foods, milk or milk products are allowed after midnight the night before your procedure.  2.   Do not drink anything colored red or purple.  Avoid juices with pulp.  No orange juice.  3.  You may drink clear liquids until 8:30AM, which is 4 hours before your procedure.                                                                                                CLEAR LIQUIDS INCLUDE: Water Jello Ice Popsicles Tea (sugar ok, no milk/cream) Powdered fruit flavored drinks Coffee (sugar ok, no milk/cream) Gatorade Juice: apple, white grape, white cranberry  Lemonade Clear bullion, consomm, broth Carbonated beverages (any kind) Strained chicken noodle soup Hard Candy   MEDICATION INSTRUCTIONS  Unless otherwise instructed, you should take regular prescription medications with a small sip of water as early as possible the morning of your procedure.          OTHER INSTRUCTIONS  You will need a responsible adult at least 58 years of age to accompany you and drive you home.   This person must remain in the waiting room during your procedure.  Wear loose fitting clothing that is easily removed.  Leave jewelry and other valuables at home.  However, you may wish to bring a book to read or an iPod/MP3 player to listen to music as you wait for your procedure to start.  Remove all body piercing jewelry and leave at home.  Total time from sign-in until discharge is approximately 2-3 hours.  You  should go home directly after your procedure and rest.  You can resume normal activities the day after your procedure.  The day of your procedure you should not:   Drive   Make legal decisions   Operate machinery   Drink alcohol   Return to work  You will receive specific instructions about eating, activities and medications before you leave.    The above instructions have been reviewed and explained to [name] by [name] LPN  September 19, 2009 12:53 PM     I fully understand and can verbalize these instructions _____________________________ Date _________

## 2010-09-02 NOTE — Letter (Signed)
Summary: EGD Instructions  [facility]  [address] [place], [place] [address]   Phone: [phone]  Fax: [phone]       Russell Huerta    10-26-1950    [name]: [id number]       Procedure Day /Date:  Thursday 09/19/2009     Arrival Time: 10:30 am     Procedure Time: 11:30 am     Location of Procedure:                    _x  _ [facility] (4th Floor)   PREPARATION FOR ENDOSCOPY   OnThursday 2/17 THE DAY OF THE PROCEDURE:  1.   No solid foods, milk or milk products are allowed after midnight the night before your procedure.  2.   Do not drink anything colored red or purple.  Avoid juices with pulp.  No orange juice.  3.  You may drink clear liquids until 9:30 am, which is 2 hours before your procedure.                                                                                                CLEAR LIQUIDS INCLUDE: Water Jello Ice Popsicles Tea (sugar ok, no milk/cream) Powdered fruit flavored drinks Coffee (sugar ok, no milk/cream) Gatorade Juice: apple, white grape, white cranberry  Lemonade Clear bullion, consomm, broth Carbonated beverages (any kind) Strained chicken noodle soup Hard Candy   MEDICATION INSTRUCTIONS  Unless otherwise instructed, you should take regular prescription medications with a small sip of water as early as possible the morning of your procedure.             OTHER INSTRUCTIONS  You will need a responsible adult at least 58 years of age to accompany you and drive you home.   This person must remain in the waiting room during your procedure.  Wear loose fitting clothing that is easily removed.  Leave jewelry and other valuables at home.  However, you may wish to bring a book to read or an iPod/MP3 player to listen to music as you wait for your procedure to start.  Remove all body piercing jewelry and leave at home.  Total time from sign-in until discharge is approximately 2-3 hours.  You  should go home directly after your procedure and rest.  You can resume normal activities the day after your procedure.  The day of your procedure you should not:   Drive   Make legal decisions   Operate machinery   Drink alcohol   Return to work  You will receive specific instructions about eating, activities and medications before you leave.    The above instructions have been reviewed and explained to me by   Ulis Rias RN  September 10, 2009 5:12 PM _    I fully understand and can verbalize these instructions _____________________________ Date _________

## 2010-09-02 NOTE — Progress Notes (Signed)
Summary: TRIAGE-post procedure update   Phone Note Call from Patient Call back at Home Phone [phone]   Caller: wife,Holly Call For: Dr. [name] Reason for Call: Talk to Nurse Summary of Call: pt had procedure yesterday and wife wanted to give an update on how he's doing today... pt experienced some dysphagia and pain in esophagus when he woke up this morning... wife giving pt clear liquids Initial call taken by: Vallarie Mare,  October 02, 2009 1:51 PM  Follow-up for Phone Call        Pt. had Endo/Balloon dil. yesterday. Per pt. wife-This morning he had a cough, sore throat & headache. Pt. went to work.  Per Dr.Kaplan- 1) Soft,bland diet. No spicy,greasy,fried foods. Advance diet as tolerated. 2) tylenol/Ibuprofen as needed 3) Warm salt water gargles, Chloraseptic spray,as needed. 4) If symptoms become worse call back immediately.  Follow-up by: [name] LPN,  October 02, 2009 3:12 PM

## 2010-09-02 NOTE — Letter (Signed)
Summary: Previsit letter  [facility]  [address] [place], [place] [address]   Phone: [phone]  Fax: [phone]       08/26/2009 [name]: [id number]  [name] [address] [place], [place]  [address]  Dear Mr. Newcombe,  Welcome to the Gastroenterology Division at [facility].    You are scheduled to see a nurse for your pre-procedure visit on 09-10-09 at 4:30pm on the 3rd floor at [facility], [address]  We ask that you try to arrive at our office 15 minutes prior to your appointment time to allow for check-in.  Your nurse visit will consist of discussing your medical and surgical history, your immediate family medical history, and your medications.    Please bring a complete list of all your medications or, if you prefer, bring the medication bottles and we will list them.  We will need to be aware of both prescribed and over the counter drugs.  We will need to know exact dosage information as well.  If you are on blood thinners (Coumadin, Plavix, Aggrenox, Ticlid, etc.) please call our office today/prior to your appointment, as we need to consult with your physician about holding your medication.   Please be prepared to read and sign documents such as consent forms, a financial agreement, and acknowledgement forms.  If necessary, and with your consent, a friend or relative is welcome to sit-in on the nurse visit with you.  Please bring your insurance card so that we may make a copy of it.  If your insurance requires a referral to see a specialist, please bring your referral form from your primary care physician.  No co-pay is required for this nurse visit.     If you cannot keep your appointment, please call [phone] to cancel or reschedule prior to your appointment date.  This allows [place] the opportunity to schedule an appointment for another patient in need of care.    Thank you for choosing [facility] for your medical  needs.  We appreciate the opportunity to care for you.  Please visit [place] at our website  to learn more about our practice.                     Sincerely.                                                                                                                   The Gastroenterology Division

## 2010-09-02 NOTE — Letter (Signed)
Summary: [facility]  [facility]   Imported By: Sherian Rein 06/11/2010 15:14:28  _____________________________________________________________________  External Attachment:    Type:   Image     Comment:   External Document

## 2010-09-02 NOTE — Progress Notes (Signed)
Summary: TRIAGE--food impaction   Phone Note Other Incoming   Summary of Call: Food impaction last night. Seen by ED staff and when he got to endoscopy the impaction cleared spontaneously. Needs a call to arrange follow-up with Dr. [name] to evaluate and treat dysphagia. Initial call taken by: Iva Boop MD, Clementeen Graham  August 23, 2009 8:39 AM  Follow-up for Phone Call        DR.KAPLAN--Last OV was 09-08-2007 and last Endo. was 11-17-2007. Does he need an appt. or direct Endo/Dil.? Follow-up by: [name] LPN,  August 23, 2009 8:51 AM  Additional Follow-up for Phone Call Additional follow up Details #1::        ok to schedule egd with dil Additional Follow-up by: Louis Meckel MD,  August 23, 2009 9:50 AM    Additional Follow-up for Phone Call Additional follow up Details #2::    Above MD orders reviewed with patient's wife. Pt. is scheduled for a previsit on 08-26-09 at 4:30pm and an Endo/Dil. in [facility] on 08-29-09 at 3pm. Pt. instructed to call back as needed.  Follow-up by: [name] LPN,  August 23, 2009 11:01 AM

## 2010-10-09 ENCOUNTER — Encounter: Payer: Self-pay | Admitting: Gastroenterology

## 2010-11-07 ENCOUNTER — Other Ambulatory Visit: Payer: Self-pay | Admitting: Endocrinology

## 2010-12-16 NOTE — Assessment & Plan Note (Signed)
[facility]                         GASTROENTEROLOGY OFFICE NOTE   Russell, Huerta                   [name]:          [id number]  DATE:09/08/2007                            DOB:          Jun 09, 1951    PROBLEM:  Chest pain.   [name] is a pleasant, 60 year old white male, who is complaining  of pyrosis.  This is almost a daily occurrence.  He also has  intermittent dysphagia to solids.  This occurs especially when he is  rushed.  He has had episodes of choking.  Several weeks ago, he was seen  at [facility] ER because of acute onset of severe lower chest and upper  abdominal pain.  He apparently underwent a workup, including cardiac  workup and scans, with no etiology determined.  Pain was not exertional.  He is on no gastric irritants, including nonsteroidals.  A recent  colonoscopy demonstrated diverticulosis and few nonspecific colonic  erosions.   PAST MEDICAL HISTORY:  Pertinent for hypertension and depression.  He  suffers from chronic headaches.   FAMILY HISTORY:  Pertinent for both parents and a sibling with diabetes.  Mother with heart problems.   MEDICATIONS:  Include Citalopram, metoprolol, lisinopril, tramadol,  Crestor, Benicar and aspirin.   ALLERGIES:  He is allergic to TRAZODONE.   He does not smoke.  He drinks rarely.  He is married and works as an  Journalist, newspaper.   REVIEW OF SYSTEMS:  Positive for back pain, sleeping problems and  fatigue.   PHYSICAL EXAMINATION:  Pulse 60, blood pressure 120/80, weight 255.  HEENT: EOMI.  PERRLA.  Sclerae are anicteric.  Conjunctivae are pink.  NECK:  Supple without thyromegaly, adenopathy or carotid bruits.  CHEST:  Clear to auscultation and percussion without adventitious  sounds.  CARDIAC:  Regular rhythm; normal S1 S2.  There are no murmurs, gallops  or rubs.  ABDOMEN:  Bowel sounds are normoactive.  Abdomen is soft, nontender and  nondistended.  There are no abdominal  masses, tenderness, splenic  enlargement or hepatomegaly.  EXTREMITIES:  Full range of motion.  No cyanosis, clubbing or edema.  RECTAL:  Deferred.   IMPRESSION:  1. Gastroesophageal reflux disease.  2. Dysphagia, rule out esophageal stricture.  3. History of chest pain.  This could have been due to esophagitis and      spasm.  Apparently, cardiac workup and workup for gallbladder      disease was negative.   RECOMMENDATION:  1. Upper endoscopy with dilatation as indicated.  2. Proton pump inhibitor therapy, unless patient decides to enroll in      a GERD trial.  3. Review records from [facility].     Barbette Hair. [name], MD,FACG  Electronically Signed    RDK/MedQ  DD: 09/08/2007  DT: 09/09/2007  Job #: [id number]

## 2010-12-19 NOTE — Assessment & Plan Note (Signed)
[facility]                                 ON-CALL NOTE   AZIM, GILLINGHAM                     [name]:          [id number]  DATE:07/27/2007                            DOB:          May 08, 1951    Phone number is [phone].  Regular doctor is Dr. Everardo All.   At 15 minutes after midnight on December 24, the patient states that he  has been having chest pain off and on which he feels like pressure on  his chest.  He was evaluated apparently over six months ago for some  similar symptoms at [facility].  He was put in the cardiac unit  for a day and had a stress test and was told that he was fine, but he  was given nitroglycerin to use.  He had used one previously and then a  second during the day and got a headache from that and does not know  what else to do.  He denies specific vomiting or shortness of breath,  but does not feel well and there is no associated fever.  I have  recommended that he be evaluated in the emergency room.  He states that  he will call 911 for this.     Neta Mends. Panosh, MD  Electronically Signed    WKP/MedQ  DD: 07/27/2007  DT: 07/27/2007  Job #: [id number]

## 2011-01-03 ENCOUNTER — Other Ambulatory Visit: Payer: Self-pay | Admitting: Endocrinology

## 2011-02-04 ENCOUNTER — Other Ambulatory Visit: Payer: Self-pay | Admitting: Endocrinology

## 2011-02-13 ENCOUNTER — Ambulatory Visit (INDEPENDENT_AMBULATORY_CARE_PROVIDER_SITE_OTHER)
Admission: RE | Admit: 2011-02-13 | Discharge: 2011-02-13 | Disposition: A | Discharge status: Home / Self Care | Payer: BC Managed Care – PPO | Source: Ambulatory Visit | Attending: Internal Medicine | Admitting: Internal Medicine

## 2011-02-13 ENCOUNTER — Encounter: Payer: Self-pay | Admitting: Internal Medicine

## 2011-02-13 ENCOUNTER — Ambulatory Visit (INDEPENDENT_AMBULATORY_CARE_PROVIDER_SITE_OTHER): Payer: Worker's Compensation | Admitting: Internal Medicine

## 2011-02-13 ENCOUNTER — Telehealth: Payer: Self-pay

## 2011-02-13 ENCOUNTER — Encounter: Payer: Self-pay | Admitting: *Deleted

## 2011-02-13 DIAGNOSIS — Z0000 Encounter for general adult medical examination without abnormal findings: Secondary | ICD-10-CM | POA: Insufficient documentation

## 2011-02-13 DIAGNOSIS — M549 Dorsalgia, unspecified: Secondary | ICD-10-CM

## 2011-02-13 DIAGNOSIS — M79609 Pain in unspecified limb: Secondary | ICD-10-CM

## 2011-02-13 DIAGNOSIS — I779 Disorder of arteries and arterioles, unspecified: Secondary | ICD-10-CM

## 2011-02-13 DIAGNOSIS — M5412 Radiculopathy, cervical region: Secondary | ICD-10-CM

## 2011-02-13 DIAGNOSIS — M79669 Pain in unspecified lower leg: Secondary | ICD-10-CM

## 2011-02-13 HISTORY — DX: Disorder of arteries and arterioles, unspecified: I77.9

## 2011-02-13 MED ORDER — CYCLOBENZAPRINE HCL 5 MG PO TABS
5.0000 mg | ORAL_TABLET | Freq: Three times a day (TID) | ORAL | Status: AC | PRN
Start: 2011-02-13 — End: 2011-02-23

## 2011-02-13 MED ORDER — ALLOPURINOL 300 MG PO TABS: 300.0000 mg | ORAL_TABLET | Freq: Every day | ORAL | Status: DC

## 2011-02-13 MED ORDER — HYDROCODONE-ACETAMINOPHEN 10-325 MG PO TABS: 1.0000 | ORAL_TABLET | Freq: Four times a day (QID) | ORAL | Status: AC | PRN

## 2011-02-13 MED ORDER — PREDNISONE 10 MG PO TABS: 10.0000 mg | ORAL_TABLET | Freq: Every day | ORAL | Status: DC

## 2011-02-13 MED ORDER — SILDENAFIL CITRATE 100 MG PO TABS: 100.0000 mg | ORAL_TABLET | Freq: Every day | ORAL | Status: DC | PRN

## 2011-02-13 NOTE — Assessment & Plan Note (Addendum)
Mid back, with right mid lateral CP as well- ? Radicular pain - for t-spine films today - r/o fx, for pain meds, work note as above

## 2011-02-13 NOTE — Assessment & Plan Note (Signed)
With mod to severe left gastroc area massive swelling/tender - ? gastroc tear/hematoma?  - for MRI left calf,  to f/u any worsening symptoms or concerns

## 2011-02-13 NOTE — Telephone Encounter (Signed)
Pt has appt at 415 today  Please call for all records from [facility] regional visit 2 days ago, including imaging studies if any  If no back imaging, he probably should be worked in earlier

## 2011-02-13 NOTE — Telephone Encounter (Signed)
Pt advised and will keep appt with Dr Jonny Ruiz today once his worker's comp representative authorizes. Pt advised to call back if he is unable to keep appt.

## 2011-02-13 NOTE — Telephone Encounter (Signed)
[facility] Triage Call Report Triage Record Num: [id number] Operator: Albertine Grates Patient Name: Russell Huerta Call Date & Time: 02/12/2011 6:15:51PM Patient Phone: [phone] PCP: Romero Belling Patient Gender: Male PCP Fax : [phone] Patient DOB: 06-14-1951 Practice Name: [facility] Reason for Call: Larey Seat yesterday at work. In a lot of pain THE PATIENT REFUSED 911 Fell at work 7-11 and fell 3-4 feet. Foot slipped off ladder and back and head hit cement floor. No LOC. Has pain in back and is going to chest. Was seen in ED 7-11 and told to follow up with MD. Shanon Rosser get out of bed 7-12 due to pain. Has taken Percocet but has not helped. Cannot take deep breath. Advised ED. Protocol(s) Used: Back Symptoms Recommended Outcome per Protocol: See ED Immediately Reason for Outcome: New onset of severe disabling back pain (unable to stand upright) Care Advice: ~ 02/12/2011 6:29:04PM Page 1 of 1 [id number]

## 2011-02-13 NOTE — Telephone Encounter (Signed)
Pt called requesting an appointment this afternoon, chest pain, severe pain and feels like "his throat is clogged up, I can't even stand to take a deep breath". Pt also says he has not had a DM since he fell 3 days ago. Pt was advised to go to nearest ER now to rule out internal injuries. Pt had appt scheduled for today at 4:15 but I advised he go to ER since appt time was so late in the day. Pt eventually agreed and stated that he will call worker's comp for authorization.  Per Dr Jonny Ruiz, I have requested records for pt's ER visit from [facility] (films, xrays, etc) Dr Jonny Ruiz will advise based on this information.

## 2011-02-13 NOTE — Patient Instructions (Addendum)
Take all new medications as prescribed - the pain medication, muscle relaxer, prednisone You will be contacted regarding the referral for: MRI for the left lower leg Please go to XRAY in the Basement for the x-ray test Please call the phone number [phone] (the PhoneTree System) for results of testing in 2-3 days;  When calling, simply dial the number, and when prompted enter the [name] number above (the Medical Record Number) and the # key, then the message should start. You are given the work note today You will be called about a followup appt with Dr Everardo All for 1 wk

## 2011-02-13 NOTE — Progress Notes (Signed)
Subjective:    Patient ID: Russell Huerta, male    DOB: 01-11-1951, 60 y.o.   [name]: [id number]  HPI  Pt of Dr Everardo All, here to f/u after a fall from a height over 3 ft at work July 11, falling backwards and to the right, striking the head and neck, right side of torso, and apparetnly the left calf somehow involved though he doesn't recall how that was involved.  Was seen in the ER with neg head CT, c-spine films, LS spine films , cxr, right rib films; - no acute fx seen.  Some c-spine and lumbar Deg changes noted, as well as carotid calcification (incidental).  Pt tx with limited rx for percocet which he cannot take due to n/v and quit after several doses.  C/o severe pain persistent to neck both ant and post, without bowel or bladder change, fever, wt loss,  worsening LE pain/numbness/weakness, gait change or falls, but with mild persisent diffuse LUE numbness.  Also c/o right lateral chest pain, sharp, severe, pleuritic, worse to cough, laugh or breathe deep, in the same area as hurt initially, but ? Related to mid thoracic area pain (no t-spine film done in ER). Also with severe swelling, tender locally to the left calf area, with minor swelling more distal, is right handed but left calf about twice as big, limps to walk but o/w no specific neurovasc symtpoms related to ankle and foot. Has aching pain of the lumbar area but less than other as above, and no clear radicular symtpoms.  HA improved. Pt denies chest pain, increased sob or doe, wheezing, orthopnea, PND, increased LE swelling, palpitations, dizziness or syncope, except for the above.  Pt denies new neurological symptoms such as new headache, or facial or extremity weakness or numbness except for the above.   Pt denies polydipsia, polyuria.   Past Medical History  Diagnosis Date  . Hypertension   . Depression   . Diverticulosis   . Colitis   . GERD (gastroesophageal reflux disease)   . Esophageal stricture   . Hyperglycemia   . Gout   .  Carotid artery disease 02/13/2011   No past surgical history on file.  reports that he has never smoked. He does not have any smokeless tobacco history on file. His alcohol and drug histories not on file. family history is not on file. Allergies  Allergen Reactions  . Percocet (Oxycodone-Acetaminophen) Nausea And Vomiting   Current Outpatient Prescriptions on File Prior to Visit  Medication Sig Dispense Refill  . aspirin-acetaminophen-caffeine (EXCEDRIN MIGRAINE) 250-250-65 MG per tablet Take 1 tablet by mouth every 6 (six) hours as needed.        . citalopram (CELEXA) 40 MG tablet Take 40 mg by mouth daily.        Marland Kitchen EPINEPHrine (EPIPEN JR) 0.15 MG/0.3ML injection Inject 0.15 mg into the muscle as needed.        . furosemide (LASIX) 20 MG tablet Take 20 mg by mouth 1 dose over 46 hours.        Marland Kitchen lisinopril (PRINIVIL,ZESTRIL) 40 MG tablet TAKE 1 TABLET BY MOUTH ONCE DAILY  30 tablet  2  . metoprolol (TOPROL-XL) 50 MG 24 hr tablet TAKE 1 TABLET BY MOUTH ONCE DAILY  30 tablet  3  . traMADol (ULTRAM) 50 MG tablet Take 50 mg by mouth every 6 (six) hours as needed.        Marland Kitchen allopurinol (ZYLOPRIM) 300 MG tablet take 1 tablet by mouth once daily  30 tablet  3  . aspirin 81 MG tablet Take 81 mg by mouth daily.        . clotrimazole-betamethasone (LOTRISONE) cream Apply topically 2 (two) times daily. Apply to skin liberally three times a day       . colchicine 0.6 MG tablet Take 0.6 mg by mouth daily. Only 2 tablets per 24 hours       . omeprazole (PRILOSEC) 20 MG capsule Take 20 mg by mouth daily. 30 minutes before each meal       . sildenafil (VIAGRA) 100 MG tablet Take 100 mg by mouth daily as needed.         Review of Systems Review of Systems  Constitutional: Negative for diaphoresis and unexpected weight change.  HENT: Negative for drooling and tinnitus.   Eyes: Negative for photophobia and visual disturbance.  Respiratory: Negative for choking and stridor.   Gastrointestinal: Negative for  vomiting and blood in stool.  Genitourinary: Negative for hematuria and decreased urine volume.    Objective:   Physical Exam BP 142/88  Pulse 84  Temp(Src) 97.9 F (36.6 C) (Oral)  Resp 16  Wt 251 lb 8 oz (114.08 kg)  SpO2 97% Physical Exam  VS noted, obese Constitutional: Pt appears well-developed.  HENT: Head: Normocephalic. Atraumatic Right Ear: External ear normal.  Left Ear: External ear normal.  Eyes: Conjunctivae and EOM are normal. Pupils are equal, round, and reactive to light.  Neck: Normal range of motion. Neck supple. Some tender diffuse paracervical and bilat SCM's noted. Spine nontender T-spine with mild right paraspinal tender approx t6 LS spine mild diffuse tender  Cardiovascular: Normal rate and regular rhythm.   Pulmonary/Chest: Effort normal and breath sounds normal.  Abd:  Soft, NT, non-distended, + BS Neurological: Pt is alert. No cranial nerve deficit. motor/sens/dtr intact except for decr sens to LT to c4 and c6 areas Skin: Skin is warm. No erythema.  Psychiatric: Pt behavior is normal. Thought content normal. 1+ nervous Left calf 3+ tender, swelling without bruise and leg o/w neurovasc intact Right lateral chest wall tender approx t6 area without rash, swelling, erythema        Assessment & Plan:

## 2011-02-13 NOTE — Assessment & Plan Note (Addendum)
Mild, left sided numbness only, for predpack and f/u with Dr Everardo All 1 wk, note for work given, cosider MRI if not improved

## 2011-02-13 NOTE — Assessment & Plan Note (Signed)
Incidental noted on films - will need carotid dopplers to further assess

## 2011-02-19 ENCOUNTER — Ambulatory Visit (HOSPITAL_COMMUNITY)
Admission: RE | Admit: 2011-02-19 | Discharge: 2011-02-19 | Disposition: A | Discharge status: Home / Self Care | Payer: Worker's Compensation | Source: Ambulatory Visit | Attending: Internal Medicine | Admitting: Internal Medicine

## 2011-02-19 ENCOUNTER — Other Ambulatory Visit: Payer: Self-pay | Admitting: Internal Medicine

## 2011-02-19 DIAGNOSIS — M79609 Pain in unspecified limb: Secondary | ICD-10-CM | POA: Insufficient documentation

## 2011-02-19 DIAGNOSIS — IMO0002 Reserved for concepts with insufficient information to code with codable children: Secondary | ICD-10-CM

## 2011-02-19 DIAGNOSIS — W19XXXA Unspecified fall, initial encounter: Secondary | ICD-10-CM | POA: Insufficient documentation

## 2011-02-19 DIAGNOSIS — M7989 Other specified soft tissue disorders: Secondary | ICD-10-CM | POA: Insufficient documentation

## 2011-02-19 DIAGNOSIS — M7120 Synovial cyst of popliteal space [Baker], unspecified knee: Secondary | ICD-10-CM

## 2011-02-19 DIAGNOSIS — R609 Edema, unspecified: Secondary | ICD-10-CM | POA: Insufficient documentation

## 2011-02-19 DIAGNOSIS — S86119A Strain of other muscle(s) and tendon(s) of posterior muscle group at lower leg level, unspecified leg, initial encounter: Secondary | ICD-10-CM

## 2011-02-19 DIAGNOSIS — S86819A Strain of other muscle(s) and tendon(s) at lower leg level, unspecified leg, initial encounter: Secondary | ICD-10-CM | POA: Insufficient documentation

## 2011-02-19 DIAGNOSIS — M79669 Pain in unspecified lower leg: Secondary | ICD-10-CM

## 2011-02-19 DIAGNOSIS — S838X9A Sprain of other specified parts of unspecified knee, initial encounter: Secondary | ICD-10-CM | POA: Insufficient documentation

## 2011-02-19 DIAGNOSIS — Z1389 Encounter for screening for other disorder: Secondary | ICD-10-CM | POA: Insufficient documentation

## 2011-02-20 ENCOUNTER — Other Ambulatory Visit (INDEPENDENT_AMBULATORY_CARE_PROVIDER_SITE_OTHER): Payer: Worker's Compensation

## 2011-02-20 ENCOUNTER — Ambulatory Visit: Payer: Worker's Compensation | Admitting: Endocrinology

## 2011-02-20 ENCOUNTER — Ambulatory Visit (INDEPENDENT_AMBULATORY_CARE_PROVIDER_SITE_OTHER): Payer: Worker's Compensation | Admitting: Endocrinology

## 2011-02-20 ENCOUNTER — Other Ambulatory Visit: Payer: Self-pay | Admitting: Endocrinology

## 2011-02-20 ENCOUNTER — Encounter: Payer: Self-pay | Admitting: Endocrinology

## 2011-02-20 ENCOUNTER — Telehealth: Payer: Self-pay

## 2011-02-20 DIAGNOSIS — E7800 Pure hypercholesterolemia, unspecified: Secondary | ICD-10-CM

## 2011-02-20 DIAGNOSIS — R7309 Other abnormal glucose: Secondary | ICD-10-CM

## 2011-02-20 DIAGNOSIS — M109 Gout, unspecified: Secondary | ICD-10-CM

## 2011-02-20 DIAGNOSIS — Z125 Encounter for screening for malignant neoplasm of prostate: Secondary | ICD-10-CM

## 2011-02-20 DIAGNOSIS — Z79899 Other long term (current) drug therapy: Secondary | ICD-10-CM

## 2011-02-20 DIAGNOSIS — F329 Major depressive disorder, single episode, unspecified: Secondary | ICD-10-CM

## 2011-02-20 DIAGNOSIS — I10 Essential (primary) hypertension: Secondary | ICD-10-CM

## 2011-02-20 LAB — URINALYSIS, ROUTINE W REFLEX MICROSCOPIC
Bilirubin Urine: NEGATIVE
Hgb urine dipstick: NEGATIVE
Nitrite: NEGATIVE
Total Protein, Urine: NEGATIVE

## 2011-02-20 LAB — HEMOGLOBIN A1C: Hgb A1c MFr Bld: 6 % (ref 4.6–6.5)

## 2011-02-20 LAB — TSH: TSH: 2.04 u[IU]/mL (ref 0.35–5.50)

## 2011-02-20 LAB — LIPID PANEL
Total CHOL/HDL Ratio: 5
Triglycerides: 420 mg/dL — ABNORMAL HIGH (ref 0.0–149.0)
VLDL: 84 mg/dL — ABNORMAL HIGH (ref 0.0–40.0)

## 2011-02-20 LAB — CBC WITH DIFFERENTIAL/PLATELET
Basophils Absolute: 0.1 10*3/uL (ref 0.0–0.1)
Eosinophils Relative: 3.2 % (ref 0.0–5.0)
HCT: 43.9 % (ref 39.0–52.0)
Lymphs Abs: 2.7 10*3/uL (ref 0.7–4.0)
MCV: 87.2 fl (ref 78.0–100.0)
Monocytes Absolute: 1.1 10*3/uL — ABNORMAL HIGH (ref 0.1–1.0)
Neutro Abs: 8.2 10*3/uL — ABNORMAL HIGH (ref 1.4–7.7)
Platelets: 424 10*3/uL — ABNORMAL HIGH (ref 150.0–400.0)
RDW: 15.1 % — ABNORMAL HIGH (ref 11.5–14.6)

## 2011-02-20 LAB — HEPATIC FUNCTION PANEL
ALT: 26 U/L (ref 0–53)
AST: 22 U/L (ref 0–37)
Bilirubin, Direct: 0 mg/dL (ref 0.0–0.3)
Total Bilirubin: 0.5 mg/dL (ref 0.3–1.2)

## 2011-02-20 LAB — BASIC METABOLIC PANEL
Chloride: 106 mEq/L (ref 96–112)
Potassium: 4 mEq/L (ref 3.5–5.1)
Sodium: 142 mEq/L (ref 135–145)

## 2011-02-20 LAB — URIC ACID: Uric Acid, Serum: 5.6 mg/dL (ref 4.0–7.8)

## 2011-02-20 NOTE — Telephone Encounter (Signed)
Message copied by Pincus Sanes on Fri Feb 20, 2011  4:25 PM ------      Message from: Deatra James      Created: Fri Feb 20, 2011  2:59 PM                   ----- Message -----         From: Oliver Barre, MD         Sent: 02/19/2011  12:03 PM           To: Dionisio Paschal, CMA            Left message on phone tree  - MRI confirms a tear of the left calf muscle, as well as Baker's cyst of the knee (swelling on the knee)                 1)   Refer to orthopedic - (I will do)           2)   Please keep appt with Dr Everardo All as planned

## 2011-02-20 NOTE — Patient Instructions (Addendum)
blood tests are being ordered for you today.  please call [phone] to hear your test results.  You will be prompted to enter the 9-digit "[name]" number that appears at the top left of this page, followed by #.  Then you will hear the message. Continue your follow-up with the orthopedic doctor.   You can take the cyclobenzaprine as needed for the leg cramps. Please make a follow-up appointment in 3 months.  You don't need to do blood tests then, though.  (update: i left message on phone-tree:  You should consider lipitor).

## 2011-02-20 NOTE — Telephone Encounter (Signed)
Called patient; left message to call back.

## 2011-02-20 NOTE — Progress Notes (Signed)
Subjective:    Patient ID: Russell Huerta, male    DOB: [birth year], 60 y.o.   [name]: [id number]  HPI Pt says he sees ortho for his recent injuries, and he feels slightly better. He has few mos of moderate intermittent cramps, in the context of sleeping.  No assoc numbness.   Past Medical History  Diagnosis Date  . Hypertension   . Depression   . Diverticulosis   . Colitis   . GERD (gastroesophageal reflux disease)   . Esophageal stricture   . Hyperglycemia   . Gout   . Carotid artery disease 02/13/2011    No past surgical history on file.  History   Social History  . Marital Status: Married    Spouse Name: N/A    Number of Children: N/A  . Years of Education: N/A   Occupational History  . Not on file.   Social History Main Topics  . Smoking status: Never Smoker   . Smokeless tobacco: Not on file  . Alcohol Use: Not on file  . Drug Use: Not on file  . Sexually Active: Not on file   Other Topics Concern  . Not on file   Social History Narrative  . No narrative on file    Current Outpatient Prescriptions on File Prior to Visit  Medication Sig Dispense Refill  . allopurinol (ZYLOPRIM) 300 MG tablet Take 1 tablet (300 mg total) by mouth daily.  90 tablet  3  . aspirin-acetaminophen-caffeine (EXCEDRIN MIGRAINE) 250-250-65 MG per tablet Take 1 tablet by mouth every 6 (six) hours as needed.        . citalopram (CELEXA) 40 MG tablet Take 40 mg by mouth daily.        . cyclobenzaprine (FLEXERIL) 5 MG tablet Take 1 tablet (5 mg total) by mouth 3 (three) times daily as needed for muscle spasms.  90 tablet  1  . EPINEPHrine (EPIPEN JR) 0.15 MG/0.3ML injection Inject 0.15 mg into the muscle as needed.        . furosemide (LASIX) 20 MG tablet Take 20 mg by mouth 1 dose over 46 hours.        Marland Kitchen HYDROcodone-acetaminophen (NORCO) 10-325 MG per tablet Take 1 tablet by mouth every 6 (six) hours as needed for pain.  60 tablet  2  . lisinopril (PRINIVIL,ZESTRIL) 40 MG tablet TAKE 1  TABLET BY MOUTH ONCE DAILY  30 tablet  2  . metoprolol (TOPROL-XL) 50 MG 24 hr tablet TAKE 1 TABLET BY MOUTH ONCE DAILY  30 tablet  3  . omeprazole (PRILOSEC) 20 MG capsule Take 20 mg by mouth daily. 30 minutes before each meal       . sildenafil (VIAGRA) 100 MG tablet Take 1 tablet (100 mg total) by mouth daily as needed.  10 tablet  5  . traMADol (ULTRAM) 50 MG tablet Take 50 mg by mouth every 6 (six) hours as needed.        Marland Kitchen aspirin 81 MG tablet Take 81 mg by mouth daily.          Allergies  Allergen Reactions  . Percocet (Oxycodone-Acetaminophen) Nausea And Vomiting   No family history on file.  BP 112/78  Pulse 77  Temp(Src) 98.2 F (36.8 C) (Oral)  Ht 5\' 6"  (1.676 m)  Wt 258 lb 1.9 oz (117.082 kg)  BMI 41.66 kg/m2  SpO2 95%  Review of Systems He attributes chest pain and sob to his injuries.    Objective:   Physical  Exam Gen: slight intermittent distress, due to pain.   LUNGS:  Clear to auscultation HEART: Regular rate and rhythm without murmurs noted. Normal S1,S2.   Pulses: dorsalis pedis intact bilat.   Feet: no deformity.  no ulcer on the feet.  feet are of normal color and temp, except for ecchymosis at the medial aspect of the left foot, and at the right great toenail.  no edema Neuro: sensation is intact to touch on the feet, except slightly decreased on the left foot (pt says this is since his injury last week).    Lab Results  Component Value Date   WBC 12.5* 02/20/2011   HGB 15.2 02/20/2011   HCT 43.9 02/20/2011   PLT 424.0* 02/20/2011   CHOL 249* 02/20/2011   TRIG 420.0 Triglyceride is over 400; calculations on Lipids are invalid.* 02/20/2011   HDL 46.00 02/20/2011   LDLDIRECT 179.9 02/20/2011   ALT 26 02/20/2011   AST 22 02/20/2011   NA 142 02/20/2011   K 4.0 02/20/2011   CL 106 02/20/2011   CREATININE 1.4 02/20/2011   BUN 27* 02/20/2011   CO2 28 02/20/2011   TSH 2.04 02/20/2011   PSA 0.78 02/20/2011   HGBA1C 6.0 02/20/2011   Assessment & Plan:  Severe  hypertriglyceridemia, exac by non-fasting state Hyperglycemia, stable. Leg cramps, new

## 2011-02-23 ENCOUNTER — Telehealth: Payer: Self-pay

## 2011-02-23 ENCOUNTER — Other Ambulatory Visit: Payer: Self-pay | Admitting: Internal Medicine

## 2011-02-23 DIAGNOSIS — R0989 Other specified symptoms and signs involving the circulatory and respiratory systems: Secondary | ICD-10-CM

## 2011-02-23 NOTE — Telephone Encounter (Signed)
Message copied by Pincus Sanes on Mon Feb 23, 2011 11:18 AM ------      Message from: Anselm Jungling      Created: Mon Feb 23, 2011 11:14 AM      Regarding: FW: carotid calcification on xray report from [facility]                   ----- Message -----         From: Oliver Barre, MD         Sent: 02/13/2011   7:33 PM           To: Margaret Pyle, CMA      Subject: carotid calcification on xray report from [facility]            Please inform, records reviewed showed this, and although this is an incidental finding, this should be further evalauted with carotid artery dopplers which I have ordered

## 2011-02-23 NOTE — Telephone Encounter (Signed)
Message copied by Pincus Sanes on Mon Feb 23, 2011 11:19 AM ------      Message from: Deatra James      Created: Fri Feb 20, 2011  2:59 PM                   ----- Message -----         From: Oliver Barre, MD         Sent: 02/19/2011  12:03 PM           To: Dionisio Paschal, CMA            Left message on phone tree  - MRI confirms a tear of the left calf muscle, as well as Baker's cyst of the knee (swelling on the knee)                 1)   Refer to orthopedic - (I will do)           2)   Please keep appt with Dr Everardo All as planned

## 2011-02-23 NOTE — Telephone Encounter (Signed)
Patient informed. 

## 2011-02-23 NOTE — Telephone Encounter (Signed)
Patient had been informed.

## 2011-02-24 ENCOUNTER — Telehealth: Payer: Self-pay

## 2011-02-24 ENCOUNTER — Encounter: Payer: Worker's Compensation | Admitting: *Deleted

## 2011-02-24 NOTE — Telephone Encounter (Signed)
Pt's spouse called stating pt has been experiencing constipation since injury and is not sure if it is related to injury directly or pain medication. Pt is requesting Rx to treat, please advise.

## 2011-02-24 NOTE — Telephone Encounter (Signed)
prob a combination of the injury itself, and the pain rx.  i advise miralax 17 g bid

## 2011-02-25 ENCOUNTER — Telehealth: Payer: Self-pay

## 2011-02-25 NOTE — Telephone Encounter (Signed)
Pt called stating he is also having urgency and weak urine flow since his injury. Pt is requesting advisement from MD.

## 2011-02-25 NOTE — Telephone Encounter (Signed)
No answer, no VM

## 2011-02-25 NOTE — Telephone Encounter (Signed)
Ov tomorrow 

## 2011-02-26 NOTE — Telephone Encounter (Signed)
Pt advised via VM to call back and schedule an appt or leave a message on triage if he needs any additional advisement.

## 2011-03-05 ENCOUNTER — Ambulatory Visit (INDEPENDENT_AMBULATORY_CARE_PROVIDER_SITE_OTHER): Payer: BC Managed Care – PPO | Admitting: Endocrinology

## 2011-03-05 ENCOUNTER — Encounter: Payer: Self-pay | Admitting: Endocrinology

## 2011-03-05 DIAGNOSIS — E7800 Pure hypercholesterolemia, unspecified: Secondary | ICD-10-CM

## 2011-03-05 MED ORDER — METFORMIN HCL ER 500 MG PO TB24: 500.0000 mg | ORAL_TABLET | Freq: Every day | ORAL | Status: DC

## 2011-03-05 NOTE — Patient Instructions (Addendum)
i have sent a prescription to your pharmacy for "metformin."  This reduces your chances of getting diabetes.   Please make a regular physical appointment in 3-4 months. We'll recheck the kidney blood test in the future.   Start crestor 10 mg daily.  Here are some samples.

## 2011-03-05 NOTE — Progress Notes (Signed)
Subjective:    Patient ID: Russell Huerta, male    DOB: April 19, 1951, 60 y.o.   [name]: [id number]  HPI The state of at least three ongoing medical problems is addressed today: Gout: no recent sxs. Hyperglycemia is again noted.  He has had great difficulty losing weight. Dyslipidemia: he has tolerated crestor in the past.   Past Medical History  Diagnosis Date  . Hypertension   . Depression   . Diverticulosis   . Colitis   . GERD (gastroesophageal reflux disease)   . Esophageal stricture   . Hyperglycemia   . Gout   . Carotid artery disease 02/13/2011    No past surgical history on file.  History   Social History  . Marital Status: Married    Spouse Name: N/A    Number of Children: N/A  . Years of Education: N/A   Occupational History  . Not on file.   Social History Main Topics  . Smoking status: Never Smoker   . Smokeless tobacco: Not on file  . Alcohol Use: Not on file  . Drug Use: Not on file  . Sexually Active: Not on file   Other Topics Concern  . Not on file   Social History Narrative  . No narrative on file    Current Outpatient Prescriptions on File Prior to Visit  Medication Sig Dispense Refill  . allopurinol (ZYLOPRIM) 300 MG tablet Take 1 tablet (300 mg total) by mouth daily.  90 tablet  3  . aspirin 81 MG tablet Take 81 mg by mouth daily.        Marland Kitchen aspirin-acetaminophen-caffeine (EXCEDRIN MIGRAINE) 250-250-65 MG per tablet Take 1 tablet by mouth every 6 (six) hours as needed.        . citalopram (CELEXA) 40 MG tablet Take 40 mg by mouth daily.        Marland Kitchen EPINEPHrine (EPIPEN JR) 0.15 MG/0.3ML injection Inject 0.15 mg into the muscle as needed.        . furosemide (LASIX) 20 MG tablet Take 20 mg by mouth daily.       Marland Kitchen HYDROcodone-acetaminophen (NORCO) 10-325 MG per tablet Take 1 tablet by mouth every 6 (six) hours as needed for pain.  60 tablet  2  . lisinopril (PRINIVIL,ZESTRIL) 40 MG tablet TAKE 1 TABLET BY MOUTH ONCE DAILY  30 tablet  2  .  metoprolol (TOPROL-XL) 50 MG 24 hr tablet TAKE 1 TABLET BY MOUTH ONCE DAILY  30 tablet  3  . omeprazole (PRILOSEC) 20 MG capsule Take 20 mg by mouth daily. 30 minutes before each meal       . sildenafil (VIAGRA) 100 MG tablet Take 1 tablet (100 mg total) by mouth daily as needed.  10 tablet  5  . traMADol (ULTRAM) 50 MG tablet Take 50 mg by mouth every 6 (six) hours as needed.          Allergies  Allergen Reactions  . Percocet (Oxycodone-Acetaminophen) Nausea And Vomiting  . Trazodone And Nefazodone     No family history on file.  BP 128/88  Pulse 107  Temp(Src) 97.6 F (36.4 C) (Oral)  Ht 5\' 6"  (1.676 m)  Wt 258 lb (117.028 kg)  BMI 41.64 kg/m2  SpO2 95%  Review of Systems Denies chest pain (except chest-wall pain, which is improved since injury) and sob.    Objective:   Physical Exam GENERAL: no distress.  Obese.   LUNGS:  Clear to auscultation HEART: Regular rate and rhythm without murmurs  noted. Normal S1,S2.    Lab Results  Component Value Date   WBC 12.5* 02/20/2011   HGB 15.2 02/20/2011   HCT 43.9 02/20/2011   PLT 424.0* 02/20/2011   CHOL 249* 02/20/2011   TRIG 420.0 Triglyceride is over 400; calculations on Lipids are invalid.* 02/20/2011   HDL 46.00 02/20/2011   LDLDIRECT 179.9 02/20/2011   ALT 26 02/20/2011   AST 22 02/20/2011   NA 142 02/20/2011   K 4.0 02/20/2011   CL 106 02/20/2011   CREATININE 1.4 02/20/2011   BUN 27* 02/20/2011   CO2 28 02/20/2011   TSH 2.04 02/20/2011   PSA 0.78 02/20/2011   HGBA1C 6.0 02/20/2011      Assessment & Plan:  Gout, well-controlled Hyperglycemia, stable Dyslipidemia, needs increased rx

## 2011-03-11 ENCOUNTER — Other Ambulatory Visit: Payer: Self-pay | Admitting: Endocrinology

## 2011-03-27 ENCOUNTER — Other Ambulatory Visit: Payer: Self-pay | Admitting: Orthopedic Surgery

## 2011-03-27 DIAGNOSIS — M48061 Spinal stenosis, lumbar region without neurogenic claudication: Secondary | ICD-10-CM

## 2011-04-09 ENCOUNTER — Other Ambulatory Visit: Payer: BC Managed Care – PPO

## 2011-05-06 ENCOUNTER — Encounter: Payer: Self-pay | Admitting: *Deleted

## 2011-05-06 NOTE — Telephone Encounter (Signed)
A user error has taken place: encounter opened in error, closed for administrative reasons.

## 2011-05-25 ENCOUNTER — Other Ambulatory Visit: Payer: Self-pay | Admitting: Endocrinology

## 2011-05-28 ENCOUNTER — Other Ambulatory Visit: Payer: Worker's Compensation

## 2011-06-04 ENCOUNTER — Encounter: Payer: Worker's Compensation | Admitting: Endocrinology

## 2011-06-04 DIAGNOSIS — Z0289 Encounter for other administrative examinations: Secondary | ICD-10-CM

## 2011-06-29 ENCOUNTER — Other Ambulatory Visit: Payer: Self-pay | Admitting: Endocrinology

## 2011-07-25 ENCOUNTER — Other Ambulatory Visit: Payer: Self-pay | Admitting: Internal Medicine

## 2011-07-25 ENCOUNTER — Other Ambulatory Visit: Payer: Self-pay | Admitting: Endocrinology

## 2011-09-10 ENCOUNTER — Telehealth: Payer: Self-pay | Admitting: Endocrinology

## 2011-09-10 NOTE — Telephone Encounter (Signed)
Left message for pt to callback office.  

## 2011-09-10 NOTE — Telephone Encounter (Signed)
Wife requesting pravastatin called in [facility] [address]

## 2011-09-11 NOTE — Telephone Encounter (Signed)
cpx is overdue.  Let's address then

## 2011-09-11 NOTE — Telephone Encounter (Signed)
Pt's wife informed of MD's advisement. She states that they will callback to schedule OV once they talk to billing because of financial situation at present time.

## 2011-09-11 NOTE — Telephone Encounter (Signed)
Pt's spouse is requesting rx for Crestor be changed to Pravastatin due to price.

## 2011-10-07 ENCOUNTER — Other Ambulatory Visit: Payer: Self-pay | Admitting: Endocrinology

## 2011-11-10 ENCOUNTER — Other Ambulatory Visit: Payer: Self-pay | Admitting: Gastroenterology

## 2011-12-21 ENCOUNTER — Other Ambulatory Visit: Payer: Self-pay | Admitting: *Deleted

## 2011-12-21 MED ORDER — SILDENAFIL CITRATE 100 MG PO TABS: 100.0000 mg | ORAL_TABLET | Freq: Every day | ORAL | Status: DC | PRN

## 2011-12-21 NOTE — Telephone Encounter (Signed)
Pt's wife called to see if samples of Crestor were available and also for refill of Viagra. Informed pt's spouse rx for Viagra sent to pharmacy and samples of Crestor upfront ready for pickup.

## 2011-12-22 ENCOUNTER — Telehealth: Payer: Self-pay | Admitting: Endocrinology

## 2011-12-22 NOTE — Telephone Encounter (Signed)
Pt's wife informed that we do not currently have any samples of Cialis of this time.

## 2011-12-22 NOTE — Telephone Encounter (Signed)
Please note policy 

## 2011-12-22 NOTE — Telephone Encounter (Signed)
Wife requesting cialis samples for pt also--

## 2012-01-31 ENCOUNTER — Other Ambulatory Visit: Payer: Self-pay | Admitting: Endocrinology

## 2012-02-15 ENCOUNTER — Other Ambulatory Visit: Payer: Self-pay

## 2012-02-15 MED ORDER — ROSUVASTATIN CALCIUM 10 MG PO TABS: 10.0000 mg | ORAL_TABLET | Freq: Every day | ORAL | Status: DC

## 2012-02-15 NOTE — Telephone Encounter (Signed)
Pt's spouse called requesting Rx refill of Crestor and samples of either cialis or viagra. Crestor refilled but no samples available of viagra or cialis. I contacted spouse to inform, no answer, no VM.

## 2012-03-01 ENCOUNTER — Other Ambulatory Visit: Payer: Self-pay | Admitting: Internal Medicine

## 2012-03-01 ENCOUNTER — Other Ambulatory Visit: Payer: Self-pay | Admitting: Endocrinology

## 2012-03-28 ENCOUNTER — Other Ambulatory Visit: Payer: Self-pay | Admitting: Endocrinology

## 2012-05-02 ENCOUNTER — Telehealth: Payer: Self-pay | Admitting: Endocrinology

## 2012-05-02 NOTE — Telephone Encounter (Signed)
Pt's wife called requesting samples of Viagra, Cialis, and cholesterol meds. She states that she was working with someone at the [facility] office on getting these samples but they never returned her call. Could not find phone notes of such a conversation. She states that pt got laid off and cannot afford to come to an appointment. Please call back and advise.

## 2012-05-02 NOTE — Telephone Encounter (Signed)
Please advise. Thanks.  

## 2012-05-02 NOTE — Telephone Encounter (Signed)
Sorry, we don't have samples available

## 2012-05-02 NOTE — Telephone Encounter (Signed)
Pt notified and was advised to talk to front desk at [place] office regarding whether or not new insurance was accepted.

## 2012-05-05 ENCOUNTER — Telehealth: Payer: Self-pay

## 2012-05-05 NOTE — Telephone Encounter (Signed)
crestor approved on 05/05/12 to 05/06/13 approval code # [id number]

## 2012-05-18 ENCOUNTER — Encounter: Payer: Self-pay | Admitting: Endocrinology

## 2012-05-18 ENCOUNTER — Ambulatory Visit (INDEPENDENT_AMBULATORY_CARE_PROVIDER_SITE_OTHER): Payer: Medicaid Other | Admitting: Endocrinology

## 2012-05-18 VITALS — BP 130/76 | HR 116 | Temp 98.3°F | Wt 273.0 lb

## 2012-05-18 DIAGNOSIS — Z79899 Other long term (current) drug therapy: Secondary | ICD-10-CM

## 2012-05-18 DIAGNOSIS — Z125 Encounter for screening for malignant neoplasm of prostate: Secondary | ICD-10-CM

## 2012-05-18 DIAGNOSIS — I10 Essential (primary) hypertension: Secondary | ICD-10-CM

## 2012-05-18 DIAGNOSIS — R7309 Other abnormal glucose: Secondary | ICD-10-CM

## 2012-05-18 DIAGNOSIS — M549 Dorsalgia, unspecified: Secondary | ICD-10-CM

## 2012-05-18 DIAGNOSIS — Z119 Encounter for screening for infectious and parasitic diseases, unspecified: Secondary | ICD-10-CM

## 2012-05-18 DIAGNOSIS — E7800 Pure hypercholesterolemia, unspecified: Secondary | ICD-10-CM

## 2012-05-18 DIAGNOSIS — M109 Gout, unspecified: Secondary | ICD-10-CM

## 2012-05-18 DIAGNOSIS — E291 Testicular hypofunction: Secondary | ICD-10-CM

## 2012-05-18 DIAGNOSIS — E119 Type 2 diabetes mellitus without complications: Secondary | ICD-10-CM | POA: Insufficient documentation

## 2012-05-18 DIAGNOSIS — K769 Liver disease, unspecified: Secondary | ICD-10-CM

## 2012-05-18 DIAGNOSIS — F329 Major depressive disorder, single episode, unspecified: Secondary | ICD-10-CM

## 2012-05-18 DIAGNOSIS — M48061 Spinal stenosis, lumbar region without neurogenic claudication: Secondary | ICD-10-CM

## 2012-05-18 LAB — LIPID PANEL
Cholesterol: 231 mg/dL — ABNORMAL HIGH (ref 0–200)
HDL: 44 mg/dL (ref >39)
Total CHOL/HDL Ratio: 5.3 Ratio
Triglycerides: 249 mg/dL — ABNORMAL HIGH (ref <150)
VLDL: 50 mg/dL — ABNORMAL HIGH (ref 0–40)

## 2012-05-18 LAB — CBC WITH DIFFERENTIAL/PLATELET
Basophils Relative: 1 % (ref 0–1)
HCT: 48.3 % (ref 39.0–52.0)
Hemoglobin: 16.5 g/dL (ref 13.0–17.0)
Lymphs Abs: 2.4 10*3/uL (ref 0.7–4.0)
MCHC: 34.2 g/dL (ref 30.0–36.0)
Monocytes Absolute: 0.8 10*3/uL (ref 0.1–1.0)
Monocytes Relative: 8 % (ref 3–12)
Neutro Abs: 6 10*3/uL (ref 1.7–7.7)
RBC: 5.63 MIL/uL (ref 4.22–5.81)

## 2012-05-18 LAB — BASIC METABOLIC PANEL
BUN: 16 mg/dL (ref 6–23)
CO2: 21 mEq/L (ref 19–32)
Chloride: 106 mEq/L (ref 96–112)
Creat: 0.96 mg/dL (ref 0.50–1.35)
Glucose, Bld: 90 mg/dL (ref 70–99)
Potassium: 4.3 mEq/L (ref 3.5–5.3)

## 2012-05-18 LAB — HEPATIC FUNCTION PANEL
AST: 29 U/L (ref 0–37)
Alkaline Phosphatase: 67 U/L (ref 39–117)
Bilirubin, Direct: 0.1 mg/dL (ref 0.0–0.3)
Total Bilirubin: 0.4 mg/dL (ref 0.3–1.2)

## 2012-05-18 MED ORDER — SILDENAFIL CITRATE 100 MG PO TABS: 100.0000 mg | ORAL_TABLET | Freq: Every day | ORAL | Status: AC | PRN

## 2012-05-18 NOTE — Progress Notes (Signed)
Subjective:    Patient ID: Russell Huerta, male    DOB: 1951/01/06, 61 y.o.   [name]: [id number]  HPI Pt fell from an alignment rack at his place of employment ([employer]).  He suffered muscle tear at the left leg, and chest contusion.  Since then, he has 15 mos of moderate pain at the lower back, but no assoc numbness.  He saw drs gioffre and tooke.   Past Medical History  Diagnosis Date  . Hypertension   . Depression   . Diverticulosis   . Colitis   . GERD (gastroesophageal reflux disease)   . Esophageal stricture   . Hyperglycemia   . Gout   . Carotid artery disease 02/13/2011    No past surgical history on file.  History   Social History  . Marital Status: Married    Spouse Name: N/A    Number of Children: N/A  . Years of Education: N/A   Occupational History  . Not on file.   Social History Main Topics  . Smoking status: Never Smoker   . Smokeless tobacco: Not on file  . Alcohol Use: Not on file  . Drug Use: Not on file  . Sexually Active: Not on file   Other Topics Concern  . Not on file   Social History Narrative  . No narrative on file    Current Outpatient Prescriptions on File Prior to Visit  Medication Sig Dispense Refill  . allopurinol (ZYLOPRIM) 300 MG tablet take 1 tablet by mouth daily  90 tablet  0  . aspirin 81 MG tablet Take 81 mg by mouth daily.        Marland Kitchen aspirin-acetaminophen-caffeine (EXCEDRIN MIGRAINE) 250-250-65 MG per tablet Take 1 tablet by mouth every 6 (six) hours as needed.        . citalopram (CELEXA) 40 MG tablet Take 40 mg by mouth daily.        . cyclobenzaprine (FLEXERIL) 5 MG tablet take 1 tablet by mouth three times a day if needed for muscle spasm  90 tablet  1  . EPINEPHrine (EPIPEN JR) 0.15 MG/0.3ML injection Inject 0.15 mg into the muscle as needed.        . furosemide (LASIX) 20 MG tablet take 1 tablet by mouth once daily  30 tablet  3  . HYDROcodone-acetaminophen (NORCO) 10-325 MG per tablet Take 1 tablet by mouth  every 6 (six) hours as needed for pain.  60 tablet  2  . lisinopril (PRINIVIL,ZESTRIL) 40 MG tablet TAKE 1 TABLET BY MOUTH ONCE DAILY  30 tablet  3  . metFORMIN (GLUCOPHAGE-XR) 500 MG 24 hr tablet take 1 tablet by mouth once daily with BREAKFAST  30 tablet  2  . metoprolol (TOPROL-XL) 50 MG 24 hr tablet TAKE 1 TABLET BY MOUTH ONCE DAILY  30 tablet  3  . metoprolol tartrate (LOPRESSOR) 25 MG tablet take 1 tablet by mouth twice a day  60 tablet  3  . omeprazole (PRILOSEC) 20 MG capsule take 1 capsule by mouth twice a day  60 capsule  11  . rosuvastatin (CRESTOR) 10 MG tablet Take 1 tablet (10 mg total) by mouth daily.  30 tablet  5  . sildenafil (VIAGRA) 100 MG tablet Take 1 tablet (100 mg total) by mouth daily as needed.  10 tablet  5  . traMADol (ULTRAM) 50 MG tablet take 1 tablet by mouth every 6 hours if needed for pain  100 tablet  4    Allergies  Allergen Reactions  . Percocet (Oxycodone-Acetaminophen) Nausea And Vomiting  . Trazodone And Nefazodone     No family history on file.  BP 130/76  Pulse 116  Temp 98.3 F (36.8 C) (Oral)  Wt 273 lb (123.832 kg)  SpO2 97%    Review of Systems He denies bowel or bladder retention.  He has weight gain and chronic headache.      Objective:   Physical Exam VITAL SIGNS:  See vs page GENERAL: no distress Pulses: dorsalis pedis intact bilat.   Feet: no deformity.  no ulcer on the feet.  feet are of normal color and temp.  no edema Neuro: sensation is intact to touch on the feet.      Lab Results  Component Value Date   WBC 9.6 05/18/2012   HGB 16.5 05/18/2012   HCT 48.3 05/18/2012   PLT 387 05/18/2012   GLUCOSE 90 05/18/2012   CHOL 231* 05/18/2012   TRIG 249* 05/18/2012   HDL 44 05/18/2012   LDLDIRECT 179.9 02/20/2011   LDLCALC 137* 05/18/2012   ALT 32 05/18/2012   AST 29 05/18/2012   NA 138 05/18/2012   K 4.3 05/18/2012   CL 106 05/18/2012   CREATININE 0.96 05/18/2012   BUN 16 05/18/2012   CO2 21 05/18/2012   TSH  1.491 05/18/2012   PSA 0.88 05/18/2012   HGBA1C 6.3* 05/18/2012   MICROALBUR 1.19 05/18/2012      Assessment & Plan:  Low-back pain, persistent after injury DM, well-controlled Dyslipidemia, therapy limited by noncompliance.  i'll do the best i can.

## 2012-05-18 NOTE — Patient Instructions (Addendum)
Any disability statement or forms would need to come from a specialist.   Refer to a PMR specialist.  you will receive a phone call, about a day and time for an appointment.  Please sign a release of information for the test results from [facility].   Please come back for a follow-up appointment in 3 months.   blood tests are being requested for you today.  You will be contacted with results.

## 2012-05-19 LAB — HIV ANTIBODY (ROUTINE TESTING W REFLEX): HIV: NONREACTIVE

## 2012-05-19 LAB — URINALYSIS, ROUTINE W REFLEX MICROSCOPIC
Bilirubin Urine: NEGATIVE
Glucose, UA: NEGATIVE mg/dL
Leukocytes, UA: NEGATIVE
Protein, ur: NEGATIVE mg/dL
Specific Gravity, Urine: 1.021 (ref 1.005–1.030)
pH: 5.5 (ref 5.0–8.0)

## 2012-05-19 LAB — HEPATITIS B SURFACE ANTIBODY,QUALITATIVE: Hep B S Ab: POSITIVE — AB

## 2012-05-19 LAB — HEPATITIS B SURFACE ANTIGEN: Hepatitis B Surface Ag: NEGATIVE

## 2012-05-19 LAB — PSA: PSA: 0.88 ng/mL (ref <=4.00)

## 2012-06-13 ENCOUNTER — Other Ambulatory Visit: Payer: Self-pay | Admitting: Endocrinology

## 2012-06-14 ENCOUNTER — Telehealth: Payer: Self-pay | Admitting: *Deleted

## 2012-06-14 NOTE — Telephone Encounter (Signed)
[place] [facility] automatically assigns you a dr.  Please see that one

## 2012-06-14 NOTE — Telephone Encounter (Signed)
Pt called is scheduled to have knee surgery with Dr. Thamas Jaegers with [facility]. Pt needs a PCP before he can have the surgery who takes [place] [employer]. Can you please recommend physician for him? Pt is frustrated. Can you please advise?

## 2012-06-14 NOTE — Telephone Encounter (Signed)
Called pt and left a detailed message on vm that [place] [employer] gave him the list (we discussed the list in our previous conversation) and that he should choose from the list provided. Pt stated in earlier call about calling the [facility] group.

## 2012-06-16 ENCOUNTER — Telehealth: Payer: Self-pay | Admitting: *Deleted

## 2012-06-16 NOTE — Telephone Encounter (Signed)
Consent for medical records release to [facility] faxed to [facility] medical records [place]

## 2012-06-23 ENCOUNTER — Telehealth: Payer: Self-pay | Admitting: Endocrinology

## 2012-06-23 NOTE — Telephone Encounter (Signed)
Pt's wife called to find out if we had either sent or received any records from his doctor for his leg.

## 2012-06-24 NOTE — Telephone Encounter (Signed)
Spoke with patient and his wife of need to sign another form to be able to send medical records to his doctor at [facility]. Patient states he will go now to medical records and sign whatever it is they need.

## 2012-07-16 ENCOUNTER — Other Ambulatory Visit: Payer: Self-pay | Admitting: Endocrinology

## 2012-08-17 ENCOUNTER — Ambulatory Visit: Payer: Medicaid Other | Admitting: Endocrinology

## 2012-08-17 ENCOUNTER — Other Ambulatory Visit: Payer: Self-pay | Admitting: Endocrinology

## 2012-09-17 ENCOUNTER — Other Ambulatory Visit: Payer: Self-pay

## 2012-09-19 ENCOUNTER — Other Ambulatory Visit: Payer: Self-pay | Admitting: Endocrinology

## 2012-11-20 ENCOUNTER — Other Ambulatory Visit: Payer: Self-pay | Admitting: Endocrinology

## 2012-11-21 ENCOUNTER — Other Ambulatory Visit: Payer: Self-pay | Admitting: *Deleted

## 2012-11-21 MED ORDER — ROSUVASTATIN CALCIUM 10 MG PO TABS: 10.0000 mg | ORAL_TABLET | Freq: Every day | ORAL | Status: DC

## 2012-11-21 MED ORDER — METOPROLOL TARTRATE 25 MG PO TABS: 25.0000 mg | ORAL_TABLET | Freq: Two times a day (BID) | ORAL | Status: DC

## 2012-11-21 MED ORDER — FUROSEMIDE 20 MG PO TABS: 20.0000 mg | ORAL_TABLET | Freq: Every day | ORAL | Status: AC

## 2013-06-08 ENCOUNTER — Other Ambulatory Visit: Payer: Self-pay

## 2014-11-27 ENCOUNTER — Ambulatory Visit (INDEPENDENT_AMBULATORY_CARE_PROVIDER_SITE_OTHER): Payer: Medicaid Other | Admitting: Endocrinology

## 2014-11-27 ENCOUNTER — Encounter: Payer: Self-pay | Admitting: Endocrinology

## 2014-11-27 VITALS — BP 130/90 | HR 82 | Temp 97.9°F | Ht 66.0 in | Wt 244.0 lb

## 2014-11-27 DIAGNOSIS — M109 Gout, unspecified: Secondary | ICD-10-CM

## 2014-11-27 DIAGNOSIS — K769 Liver disease, unspecified: Secondary | ICD-10-CM

## 2014-11-27 DIAGNOSIS — Z125 Encounter for screening for malignant neoplasm of prostate: Secondary | ICD-10-CM

## 2014-11-27 DIAGNOSIS — M545 Low back pain: Secondary | ICD-10-CM

## 2014-11-27 DIAGNOSIS — I10 Essential (primary) hypertension: Secondary | ICD-10-CM

## 2014-11-27 DIAGNOSIS — R079 Chest pain, unspecified: Secondary | ICD-10-CM

## 2014-11-27 DIAGNOSIS — E7800 Pure hypercholesterolemia, unspecified: Secondary | ICD-10-CM

## 2014-11-27 DIAGNOSIS — E119 Type 2 diabetes mellitus without complications: Secondary | ICD-10-CM

## 2014-11-27 LAB — URINALYSIS, ROUTINE W REFLEX MICROSCOPIC
Bilirubin Urine: NEGATIVE
Hgb urine dipstick: NEGATIVE
Ketones, ur: NEGATIVE
LEUKOCYTES UA: NEGATIVE
Nitrite: NEGATIVE
PH: 5.5 (ref 5.0–8.0)
RBC / HPF: NONE SEEN (ref 0–?)
Specific Gravity, Urine: 1.025 (ref 1.000–1.030)
Total Protein, Urine: NEGATIVE
URINE GLUCOSE: NEGATIVE
UROBILINOGEN UA: 0.2 (ref 0.0–1.0)

## 2014-11-27 LAB — CBC WITH DIFFERENTIAL/PLATELET
BASOS PCT: 0.7 % (ref 0.0–3.0)
Basophils Absolute: 0.1 10*3/uL (ref 0.0–0.1)
EOS ABS: 0.4 10*3/uL (ref 0.0–0.7)
EOS PCT: 4 % (ref 0.0–5.0)
HCT: 49.6 % (ref 39.0–52.0)
Hemoglobin: 16.3 g/dL (ref 13.0–17.0)
LYMPHS ABS: 2.2 10*3/uL (ref 0.7–4.0)
LYMPHS PCT: 21.8 % (ref 12.0–46.0)
MCHC: 33 g/dL (ref 30.0–36.0)
MCV: 88.2 fl (ref 78.0–100.0)
Monocytes Absolute: 0.7 10*3/uL (ref 0.1–1.0)
Monocytes Relative: 6.7 % (ref 3.0–12.0)
NEUTROS PCT: 66.8 % (ref 43.0–77.0)
Neutro Abs: 6.7 10*3/uL (ref 1.4–7.7)
Platelets: 350 10*3/uL (ref 150.0–400.0)
RBC: 5.63 Mil/uL (ref 4.22–5.81)
RDW: 15.1 % (ref 11.5–15.5)
WBC: 10 10*3/uL (ref 4.0–10.5)

## 2014-11-27 LAB — HEPATIC FUNCTION PANEL
ALT: 20 U/L (ref 0–53)
AST: 18 U/L (ref 0–37)
Albumin: 4.3 g/dL (ref 3.5–5.2)
Alkaline Phosphatase: 67 U/L (ref 39–117)
BILIRUBIN DIRECT: 0.1 mg/dL (ref 0.0–0.3)
TOTAL PROTEIN: 7.2 g/dL (ref 6.0–8.3)
Total Bilirubin: 0.5 mg/dL (ref 0.2–1.2)

## 2014-11-27 LAB — LIPID PANEL
CHOL/HDL RATIO: 5
Cholesterol: 184 mg/dL (ref 0–200)
HDL: 37.5 mg/dL — AB (ref >39.00)
LDL Cholesterol: 111 mg/dL — ABNORMAL HIGH (ref 0–99)
NonHDL: 146.5
TRIGLYCERIDES: 177 mg/dL — AB (ref 0.0–149.0)
VLDL: 35.4 mg/dL (ref 0.0–40.0)

## 2014-11-27 LAB — BASIC METABOLIC PANEL
BUN: 18 mg/dL (ref 6–23)
CALCIUM: 9.9 mg/dL (ref 8.4–10.5)
CHLORIDE: 107 meq/L (ref 96–112)
CO2: 23 mEq/L (ref 19–32)
CREATININE: 0.98 mg/dL (ref 0.40–1.50)
GFR: 82.41 mL/min (ref >60.00)
GLUCOSE: 84 mg/dL (ref 70–99)
Potassium: 4.3 mEq/L (ref 3.5–5.1)
Sodium: 138 mEq/L (ref 135–145)

## 2014-11-27 LAB — HEMOGLOBIN A1C: HEMOGLOBIN A1C: 5.6 % (ref 4.6–6.5)

## 2014-11-27 LAB — MICROALBUMIN / CREATININE URINE RATIO
CREATININE, U: 151.3 mg/dL
Microalb Creat Ratio: 0.5 mg/g (ref 0.0–30.0)
Microalb, Ur: 0.7 mg/dL (ref 0.0–1.9)

## 2014-11-27 LAB — TSH: TSH: 1.51 u[IU]/mL (ref 0.35–4.50)

## 2014-11-27 LAB — PSA: PSA: 0.83 ng/mL (ref 0.10–4.00)

## 2014-11-27 LAB — URIC ACID: Uric Acid, Serum: 5.1 mg/dL (ref 4.0–7.8)

## 2014-11-27 NOTE — Patient Instructions (Addendum)
Please continue to see the pain specialist in [facility] Here is a new meter.  Here is a list of places to get strips.  check your blood sugar once a day.  vary the time of day when you check, between before the 3 meals, and at bedtime.  also check if you have symptoms of your blood sugar being too high or too low.  please keep a record of the readings and bring it to your next appointment here.  You can write it on any piece of paper.  please call us sooner if your blood sugar goes below 70, or if you have a lot of readings over 200. blood tests are requested for you today.  We'll let you know about the results. Let's check a treadmill test.  you will receive a phone call, about a day and time for an appointment. Please come in soon for a regular physical.

## 2014-11-27 NOTE — Progress Notes (Signed)
Subjective:    Patient ID: Russell Huerta, male    DOB: 11-04-1950, 64 y.o.   [name]: [id number]  HPI Pt returns for f/u of diabetes mellitus: DM type: 2 Dx'ed: 2012 Complications: CAD, PAD, and polyneuropathy.  Therapy: metformin DKA: never Severe hypoglycemia: never Pancreatitis: never. Other: he has never been on insulin.  Interval history: he has lost weight;  He says cbg's are well-controlled.  Low-back pain: persists.  He was dismissed from [facility] due to missing appts.   He has many years of intermittent non-exertional chest pain.  Past Medical History  Diagnosis Date  . Hypertension   . Depression   . Diverticulosis   . Colitis   . GERD (gastroesophageal reflux disease)   . Esophageal stricture   . Hyperglycemia   . Gout   . Carotid artery disease 02/13/2011    No past surgical history on file.  History   Social History  . Marital Status: Married    Spouse Name: N/A  . Number of Children: N/A  . Years of Education: N/A   Occupational History  . Not on file.   Social History Main Topics  . Smoking status: Never Smoker   . Smokeless tobacco: Not on file  . Alcohol Use: Not on file  . Drug Use: Not on file  . Sexual Activity: Not on file   Other Topics Concern  . Not on file   Social History Narrative    Current Outpatient Prescriptions on File Prior to Visit  Medication Sig Dispense Refill  . allopurinol (ZYLOPRIM) 300 MG tablet take 1 tablet by mouth once daily for gout 30 tablet 2  . citalopram (CELEXA) 40 MG tablet Take 40 mg by mouth daily.      . cyclobenzaprine (FLEXERIL) 5 MG tablet take 1 tablet by mouth three times a day if needed for muscle spasm 90 tablet 1  . EPINEPHrine (EPIPEN JR) 0.15 MG/0.3ML injection Inject 0.15 mg into the muscle as needed.      . furosemide (LASIX) 20 MG tablet Take 1 tablet (20 mg total) by mouth daily. 30 tablet 3  . HYDROcodone-acetaminophen (NORCO) 10-325 MG per tablet Take 1 tablet by mouth every  6 (six) hours as needed for pain. 60 tablet 2  . lisinopril (PRINIVIL,ZESTRIL) 40 MG tablet take 1 tablet by mouth once daily for blood pressure 30 tablet 2  . metFORMIN (GLUCOPHAGE-XR) 500 MG 24 hr tablet take 1 tablet by mouth once daily with BREAKFAST 30 tablet 2  . omeprazole (PRILOSEC) 20 MG capsule take 1 capsule by mouth twice a day 60 capsule 11  . sildenafil (VIAGRA) 100 MG tablet Take 1 tablet (100 mg total) by mouth daily as needed. 10 tablet 5  . traMADol (ULTRAM) 50 MG tablet take 1 tablet by mouth every 6 hours if needed for pain 100 tablet 4   No current facility-administered medications on file prior to visit.    Allergies  Allergen Reactions  . Percocet [Oxycodone-Acetaminophen] Nausea And Vomiting  . Trazodone And Nefazodone     No family history on file.  BP 130/90 mmHg  Pulse 82  Temp(Src) 97.9 F (36.6 C) (Oral)  Ht 5\' 6"  (1.676 m)  Wt 244 lb (110.678 kg)  BMI 39.40 kg/m2  SpO2 95%   Review of Systems Denies sob and edema.     Objective:   Physical Exam VITAL SIGNS:  See vs page GENERAL: no distress Pulses: dorsalis pedis intact bilat.   MSK: no deformity  of the feet CV: no leg edema.  Skin:  no ulcer on the feet.  normal color and temp on the feet. Neuro: sensation is intact to touch on the feet, but decreased form normal   Lab Results  Component Value Date   WBC 10.0 11/27/2014   HGB 16.3 11/27/2014   HCT 49.6 11/27/2014   PLT 350.0 11/27/2014   GLUCOSE 84 11/27/2014   CHOL 184 11/27/2014   TRIG 177.0* 11/27/2014   HDL 37.50* 11/27/2014   LDLDIRECT 179.9 02/20/2011   LDLCALC 111* 11/27/2014   ALT 20 11/27/2014   AST 18 11/27/2014   NA 138 11/27/2014   K 4.3 11/27/2014   CL 107 11/27/2014   CREATININE 0.98 11/27/2014   BUN 18 11/27/2014   CO2 23 11/27/2014   TSH 1.51 11/27/2014   PSA 0.83 11/27/2014   HGBA1C 5.6 11/27/2014   MICROALBUR 0.7 11/27/2014  i personally reviewed electrocardiogram tracing: normal     Assessment &  Plan:    Patient is advised the following: Patient Instructions  Please continue to see the pain specialist in [facility] Here is a new meter.  Here is a list of places to get strips.  check your blood sugar once a day.  vary the time of day when you check, between before the 3 meals, and at bedtime.  also check if you have symptoms of your blood sugar being too high or too low.  please keep a record of the readings and bring it to your next appointment here.  You can write it on any piece of paper.  please call us sooner if your blood sugar goes below 70, or if you have a lot of readings over 200. blood tests are requested for you today.  We'll let you know about the results. Let's check a treadmill test.  you will receive a phone call, about a day and time for an appointment. Please come in soon for a regular physical.

## 2014-11-28 LAB — HEPATITIS B SURFACE ANTIGEN: Hepatitis B Surface Ag: NEGATIVE

## 2014-11-28 LAB — HEPATITIS C ANTIBODY: HCV Ab: NEGATIVE

## 2014-11-28 MED ORDER — ATORVASTATIN CALCIUM 40 MG PO TABS: 40.0000 mg | ORAL_TABLET | Freq: Every day | ORAL | Status: AC

## 2014-12-06 ENCOUNTER — Encounter: Payer: Self-pay | Admitting: Gastroenterology

## 2015-02-05 ENCOUNTER — Ambulatory Visit: Payer: Self-pay | Admitting: Nurse Practitioner

## 2015-02-05 ENCOUNTER — Ambulatory Visit: Payer: Self-pay | Admitting: Physician Assistant

## 2015-03-13 ENCOUNTER — Encounter: Payer: Self-pay | Admitting: Nurse Practitioner

## 2015-04-17 ENCOUNTER — Telehealth: Payer: Self-pay

## 2015-04-17 NOTE — Telephone Encounter (Signed)
Left pt a Vm to return call for appt

## 2017-08-12 ENCOUNTER — Encounter: Payer: Self-pay | Admitting: Gastroenterology

## 2019-08-25 ENCOUNTER — Emergency Department (HOSPITAL_COMMUNITY)
Admission: EM | Admit: 2019-08-25 | Discharge: 2019-08-25 | Disposition: A | Discharge status: Home / Self Care | Payer: Medicare HMO | Attending: Emergency Medicine | Admitting: Emergency Medicine

## 2019-08-25 ENCOUNTER — Emergency Department (HOSPITAL_COMMUNITY): Payer: Medicare HMO

## 2019-08-25 DIAGNOSIS — Z7984 Long term (current) use of oral hypoglycemic drugs: Secondary | ICD-10-CM | POA: Diagnosis not present

## 2019-08-25 DIAGNOSIS — M79662 Pain in left lower leg: Secondary | ICD-10-CM | POA: Insufficient documentation

## 2019-08-25 DIAGNOSIS — R079 Chest pain, unspecified: Secondary | ICD-10-CM | POA: Insufficient documentation

## 2019-08-25 DIAGNOSIS — R109 Unspecified abdominal pain: Secondary | ICD-10-CM | POA: Diagnosis present

## 2019-08-25 DIAGNOSIS — Y9389 Activity, other specified: Secondary | ICD-10-CM | POA: Insufficient documentation

## 2019-08-25 DIAGNOSIS — M549 Dorsalgia, unspecified: Secondary | ICD-10-CM | POA: Diagnosis not present

## 2019-08-25 DIAGNOSIS — Y92410 Unspecified street and highway as the place of occurrence of the external cause: Secondary | ICD-10-CM | POA: Diagnosis not present

## 2019-08-25 DIAGNOSIS — R071 Chest pain on breathing: Secondary | ICD-10-CM | POA: Insufficient documentation

## 2019-08-25 DIAGNOSIS — Z79899 Other long term (current) drug therapy: Secondary | ICD-10-CM | POA: Diagnosis not present

## 2019-08-25 DIAGNOSIS — Y998 Other external cause status: Secondary | ICD-10-CM | POA: Diagnosis not present

## 2019-08-25 DIAGNOSIS — Z789 Other specified health status: Secondary | ICD-10-CM

## 2019-08-25 LAB — COMPREHENSIVE METABOLIC PANEL
ALT: 35 U/L (ref 0–44)
AST: 29 U/L (ref 15–41)
Albumin: 3.8 g/dL (ref 3.5–5.0)
Alkaline Phosphatase: 62 U/L (ref 38–126)
Anion gap: 10 (ref 5–15)
BUN: 19 mg/dL (ref 8–23)
CO2: 19 mmol/L — ABNORMAL LOW (ref 22–32)
Calcium: 9.5 mg/dL (ref 8.9–10.3)
Chloride: 109 mmol/L (ref 98–111)
Creatinine, Ser: 1 mg/dL (ref 0.61–1.24)
GFR calc Af Amer: >60 mL/min (ref >60)
GFR calc non Af Amer: >60 mL/min (ref >60)
Glucose, Bld: 124 mg/dL — ABNORMAL HIGH (ref 70–99)
Potassium: 4.1 mmol/L (ref 3.5–5.1)
Sodium: 138 mmol/L (ref 135–145)
Total Bilirubin: 0.8 mg/dL (ref 0.3–1.2)
Total Protein: 7.8 g/dL (ref 6.5–8.1)

## 2019-08-25 LAB — I-STAT CHEM 8, ED
BUN: 20 mg/dL (ref 8–23)
Calcium, Ion: 1.14 mmol/L — ABNORMAL LOW (ref 1.15–1.40)
Chloride: 112 mmol/L — ABNORMAL HIGH (ref 98–111)
Creatinine, Ser: 0.8 mg/dL (ref 0.61–1.24)
Glucose, Bld: 97 mg/dL (ref 70–99)
HCT: 47 % (ref 39.0–52.0)
Hemoglobin: 16 g/dL (ref 13.0–17.0)
Potassium: 3.9 mmol/L (ref 3.5–5.1)
Sodium: 143 mmol/L (ref 135–145)
TCO2: 21 mmol/L — ABNORMAL LOW (ref 22–32)

## 2019-08-25 LAB — CBC
HCT: 50.2 % (ref 39.0–52.0)
Hemoglobin: 16.7 g/dL (ref 13.0–17.0)
MCH: 29 pg (ref 26.0–34.0)
MCHC: 33.3 g/dL (ref 30.0–36.0)
MCV: 87.2 fL (ref 80.0–100.0)
Platelets: 283 10*3/uL (ref 150–400)
RBC: 5.76 MIL/uL (ref 4.22–5.81)
RDW: 15.1 % (ref 11.5–15.5)
WBC: 8.5 10*3/uL (ref 4.0–10.5)
nRBC: 0 % (ref 0.0–0.2)

## 2019-08-25 LAB — SAMPLE TO BLOOD BANK

## 2019-08-25 MED ORDER — IOHEXOL 300 MG/ML  SOLN
100.0000 mL | Freq: Once | INTRAMUSCULAR | Status: AC | PRN
  Administered 2019-08-25: 100 mL via INTRAVENOUS

## 2019-08-25 MED ORDER — FENTANYL CITRATE (PF) 100 MCG/2ML IJ SOLN
50.0000 ug | Freq: Once | INTRAMUSCULAR | Status: AC
  Administered 2019-08-25: 50 ug via INTRAVENOUS
  Filled 2019-08-25: qty 2

## 2019-08-25 NOTE — ED Notes (Signed)
Wife [phone] please  call
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
## 2019-08-25 NOTE — Discharge Instructions (Addendum)
Please return to the ED for worsening pain, fevers, or inability to walk. Follow up with PCP for symptoms as well. Your CT scans were negative for any acute injuries as well as your Xrays. Tylenol and motrin for pain, ICE, rest and elevation and compression for your left shin pain.
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
## 2021-09-16 IMAGING — CT CT T SPINE W/O CM
3 of 5 series · 13 of 36 positions shown, 14 images · non-contrast
Comparison: None.

CLINICAL DATA: Status post trauma.

EXAM:
CT THORACIC SPINE WITHOUT CONTRAST
TECHNIQUE: Multidetector CT images of the thoracic were obtained using the
standard protocol without intravenous contrast.

[Series 1: cap with · axial · 0.57mm/px · z∈[+744,+982]mm · 4 of 179 slices shown, 5 images]
[im 30/179  soft-tissue]
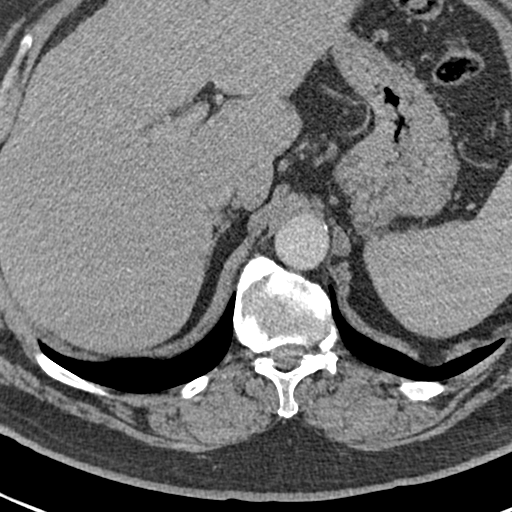
[im 30/179  bone]
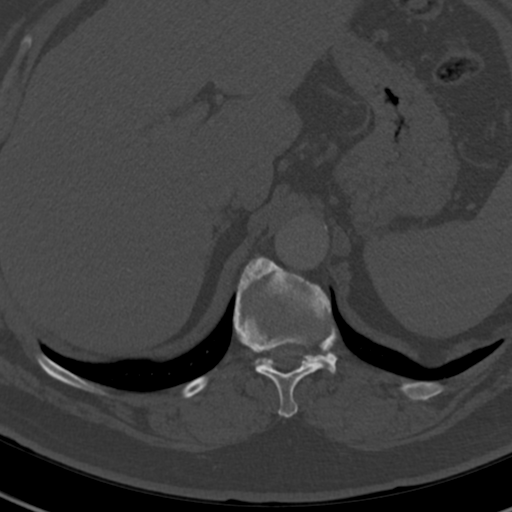
[im 60/179  bone]
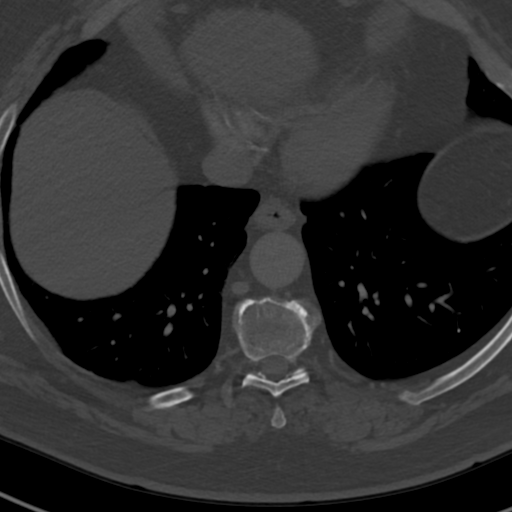
[im 119/179  bone]
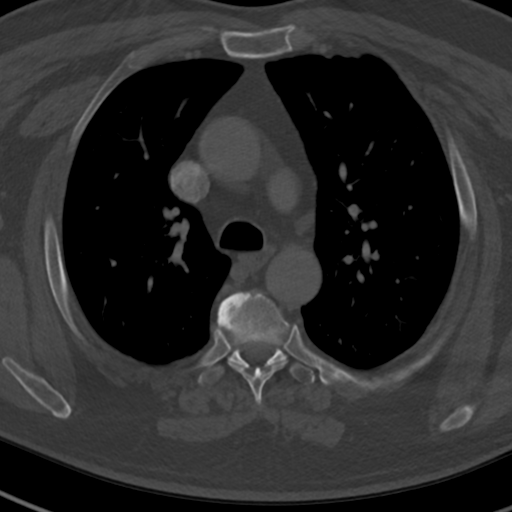
[im 149/179  bone]
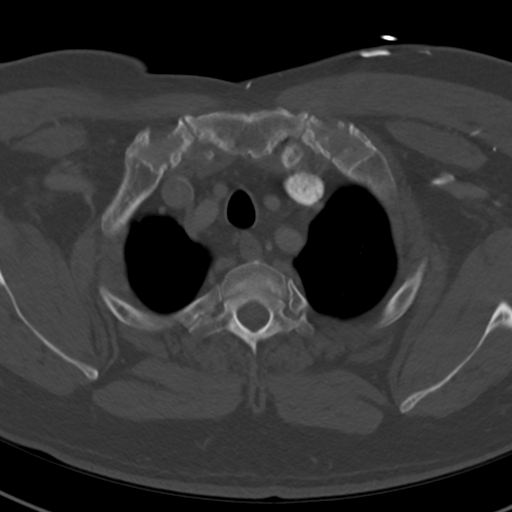

[Series 7: tspine · coronal · 0.61mm/px · 3 of 145 slices shown (1 of 2)]
[im 29/145  bone]
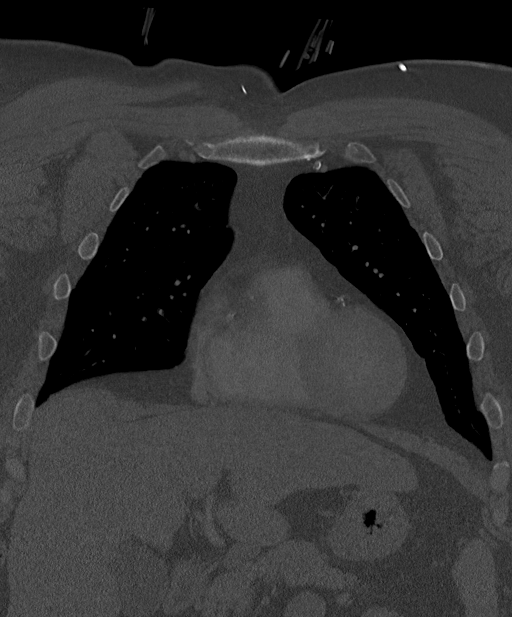
[im 58/145  bone]
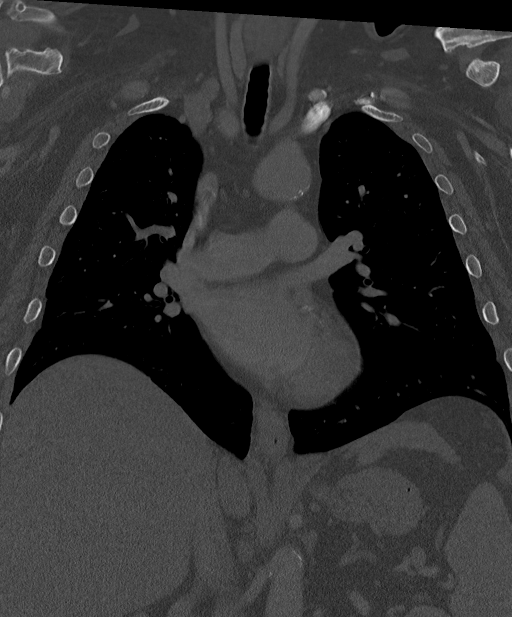
[im 87/145  bone]
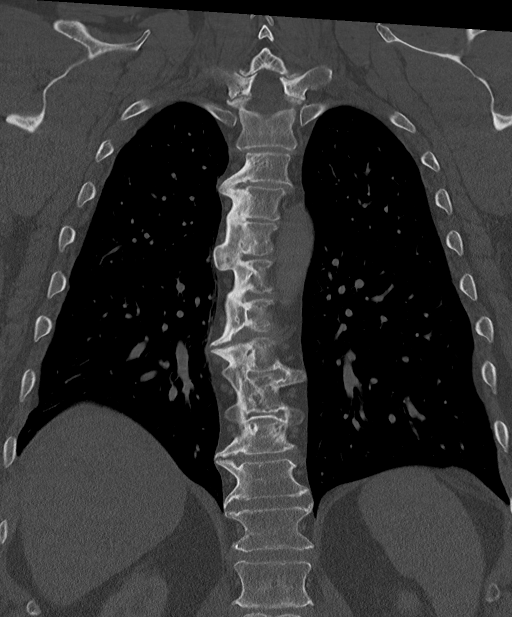

[Series 8: tspine · sagittal · 0.56mm/px · 6 of 156 slices shown (2 of 2)]
[im 23/156  bone]
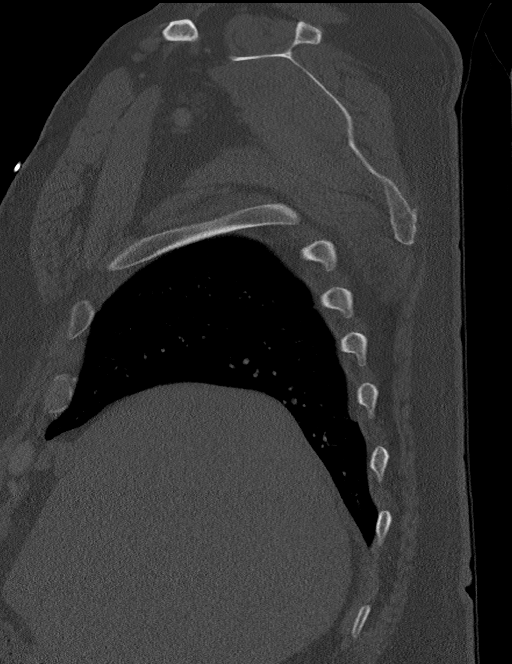
[im 41/156  soft-tissue]
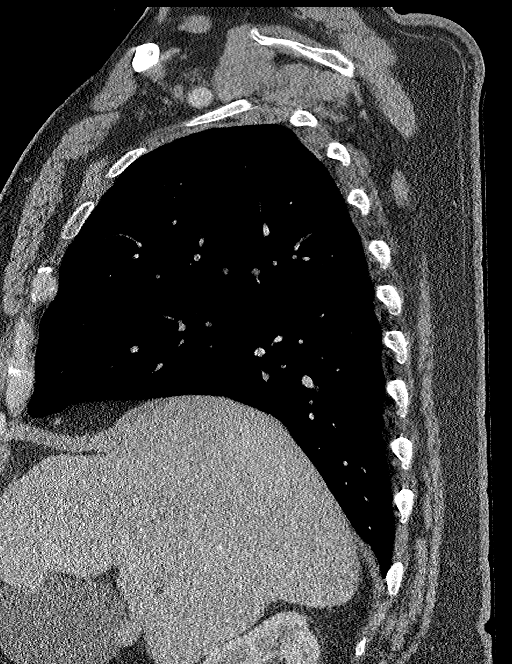
[im 45/156  bone]
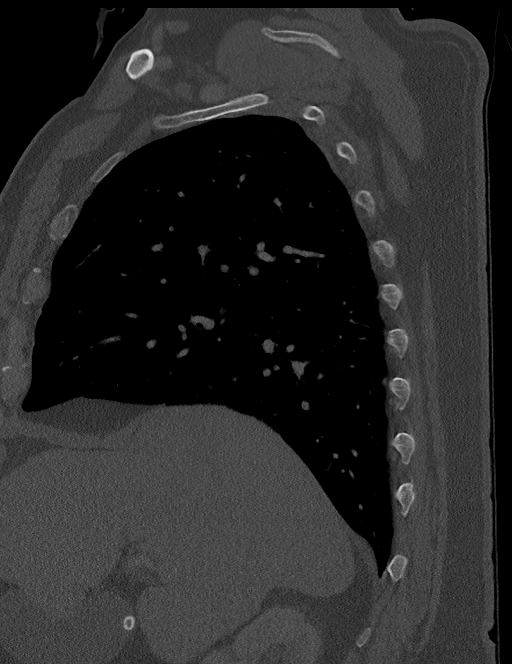
[im 67/156  bone]
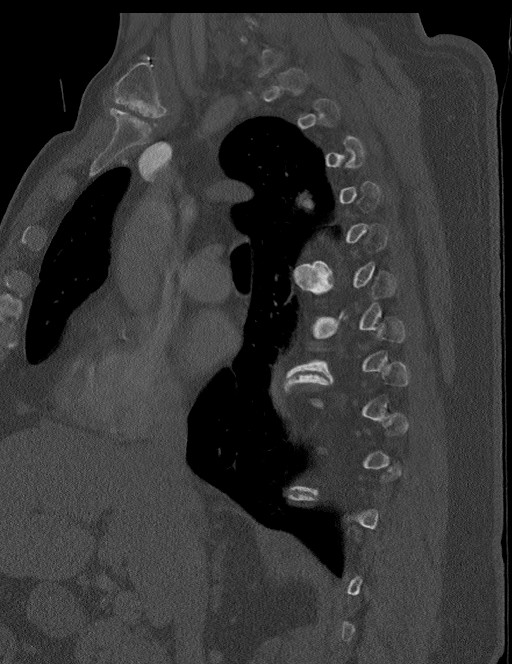
[im 89/156  bone]
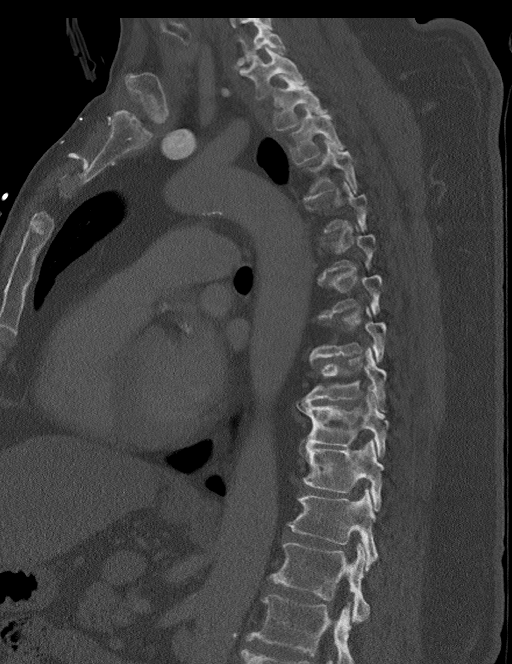
[im 111/156  bone]
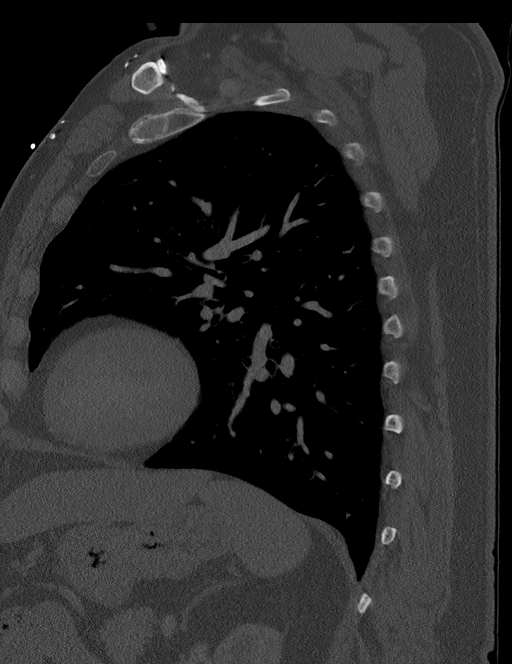

[13 of 36 positions shown; findings below may reference images not displayed]

FINDINGS: Alignment: Normal.

Vertebrae: No acute fracture or focal pathologic process.

Paraspinal and other soft tissues: Negative.

Disc levels:

T1-2: Moderate-severity endplates sclerosis. Mild to moderate
severity intervertebral disc space narrowing. Bilateral facet joint
hypertrophy. Normal central canal and bilateral lateral recesses.
Mild narrowing of the bilateral intervertebral neural foramina.
T2-3: Moderate-severity endplates sclerosis. Mild to moderate
severity intervertebral disc space narrowing. Bilateral facet joint
hypertrophy. Normal central canal and bilateral lateral recesses.
Mild narrowing of the bilateral intervertebral neural foramina.
T3-4: Moderate-severity endplates sclerosis. Mild to moderate
severity intervertebral disc space narrowing. Bilateral facet joint
hypertrophy. Normal central canal and bilateral lateral recesses.
Mild narrowing of the bilateral intervertebral neural foramina.
T4-5: Moderate-severity endplates sclerosis. Mild to moderate
severity intervertebral disc space narrowing. Bilateral facet joint
hypertrophy. Normal central canal and bilateral lateral recesses.
Mild narrowing of the bilateral intervertebral neural foramina.
T5-6: Moderate-severity endplates sclerosis. Mild to moderate
severity intervertebral disc space narrowing. Bilateral facet joint
hypertrophy. Normal central canal and bilateral lateral recesses.
Mild narrowing of the bilateral intervertebral neural foramina.
T6-7: Moderate-severity endplates sclerosis. Mild to moderate
severity intervertebral disc space narrowing. Bilateral facet joint
hypertrophy. Normal central canal and bilateral lateral recesses.
Mild narrowing of the bilateral intervertebral neural foramina.
T7-8: Moderate-severity endplates sclerosis. Mild to moderate
severity intervertebral disc space narrowing. Bilateral facet joint
hypertrophy. Normal central canal and bilateral lateral recesses.
Mild narrowing of the bilateral intervertebral neural foramina.
T8-9: Moderate-severity endplates sclerosis. Mild to moderate
severity intervertebral disc space narrowing. Bilateral facet joint
hypertrophy. Normal central canal and bilateral lateral recesses.
Mild narrowing of the bilateral intervertebral neural foramina.
T9-10: Moderate-severity endplates sclerosis. Mild to moderate
severity intervertebral disc space narrowing. Bilateral facet joint
hypertrophy. Normal central canal and bilateral lateral recesses.
Mild narrowing of the bilateral intervertebral neural foramina.
T10-11: Moderate-severity endplates sclerosis. Mild to moderate
severity intervertebral disc space narrowing. Bilateral facet joint
hypertrophy. Normal central canal and bilateral lateral recesses.
Mild narrowing of the bilateral intervertebral neural foramina.
T11-12: Moderate-severity endplates sclerosis. Mild to moderate
severity intervertebral disc space narrowing. Bilateral facet joint
hypertrophy. Normal central canal and bilateral lateral recesses.
Mild narrowing of the bilateral intervertebral neural foramina.
IMPRESSION: 1. No acute osseous abnormality. Moderate severity multilevel
degenerative changes.
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
## 2024-08-11 ENCOUNTER — Encounter (HOSPITAL_BASED_OUTPATIENT_CLINIC_OR_DEPARTMENT_OTHER): Payer: Self-pay

## 2024-08-11 ENCOUNTER — Ambulatory Visit (HOSPITAL_BASED_OUTPATIENT_CLINIC_OR_DEPARTMENT_OTHER): Admit: 2024-08-11 | Admitting: Orthopedic Surgery

## 2024-08-11 SURGERY — EXCISION OF NAIL MATRIX BED
Anesthesia: Choice | Site: Middle Finger | Laterality: Left
